# Patient Record
Sex: Female | Born: 1975 | Hispanic: Yes | Marital: Single | Smoking: Never smoker
Health system: Southern US, Community
[De-identification: ages and names within clinical notes are randomized; demographics above are authoritative.]

## PROBLEM LIST (undated history)

## (undated) DIAGNOSIS — O24419 Gestational diabetes mellitus in pregnancy, unspecified control: Secondary | ICD-10-CM

## (undated) DIAGNOSIS — Z3009 Encounter for other general counseling and advice on contraception: Secondary | ICD-10-CM

## (undated) HISTORY — DX: Gestational diabetes mellitus in pregnancy, unspecified control: O24.419

## (undated) HISTORY — PX: INTRAUTERINE DEVICE (IUD) INSERTION: SHX5877

## (undated) HISTORY — PX: CHOLECYSTECTOMY, LAPAROSCOPIC: SHX56

## (undated) HISTORY — DX: Encounter for other general counseling and advice on contraception: Z30.09

## (undated) HISTORY — PX: CHOLECYSTECTOMY: SHX55

---

## 1999-01-22 ENCOUNTER — Ambulatory Visit (HOSPITAL_COMMUNITY): Admission: RE | Admit: 1999-01-22 | Discharge: 1999-01-22 | Payer: Self-pay | Admitting: *Deleted

## 1999-06-27 ENCOUNTER — Inpatient Hospital Stay (HOSPITAL_COMMUNITY): Admission: AD | Admit: 1999-06-27 | Discharge: 1999-06-28 | Payer: Self-pay | Admitting: Obstetrics

## 2001-03-08 ENCOUNTER — Inpatient Hospital Stay (HOSPITAL_COMMUNITY): Admission: EM | Admit: 2001-03-08 | Discharge: 2001-03-09 | Payer: Self-pay | Admitting: Emergency Medicine

## 2002-11-29 ENCOUNTER — Ambulatory Visit (HOSPITAL_COMMUNITY): Admission: RE | Admit: 2002-11-29 | Discharge: 2002-11-29 | Payer: Self-pay | Admitting: *Deleted

## 2003-04-16 ENCOUNTER — Inpatient Hospital Stay (HOSPITAL_COMMUNITY): Admission: AD | Admit: 2003-04-16 | Discharge: 2003-04-18 | Payer: Self-pay | Admitting: *Deleted

## 2013-04-27 ENCOUNTER — Ambulatory Visit: Payer: No Typology Code available for payment source | Attending: Family Medicine | Admitting: Family Medicine

## 2013-04-27 ENCOUNTER — Encounter: Payer: Self-pay | Admitting: Family Medicine

## 2013-04-27 VITALS — BP 118/73 | HR 60 | Temp 97.4°F | Resp 14 | Ht 63.0 in | Wt 146.0 lb

## 2013-04-27 DIAGNOSIS — Z30431 Encounter for routine checking of intrauterine contraceptive device: Secondary | ICD-10-CM

## 2013-04-27 DIAGNOSIS — Z113 Encounter for screening for infections with a predominantly sexual mode of transmission: Secondary | ICD-10-CM

## 2013-04-27 DIAGNOSIS — Z975 Presence of (intrauterine) contraceptive device: Secondary | ICD-10-CM | POA: Insufficient documentation

## 2013-04-27 DIAGNOSIS — Z124 Encounter for screening for malignant neoplasm of cervix: Secondary | ICD-10-CM

## 2013-04-27 LAB — POCT URINALYSIS DIPSTICK
Bilirubin, UA: NEGATIVE
Glucose, UA: NEGATIVE
Ketones, UA: NEGATIVE
Nitrite, UA: NEGATIVE
Protein, UA: NEGATIVE
Spec Grav, UA: 1.025
Urobilinogen, UA: 0.2
pH, UA: 6

## 2013-04-27 NOTE — Progress Notes (Signed)
Patient ID: Heather Wilkins, female   DOB: 11/16/1975, 37 y.o.   MRN: [id number]  CC: pt presenting as a new patient   HPI: Pt is reporting that she is wanting to have a PAP and pelvic exam done as it has been 4 years since the last one. She has never had an abnormal PAP exam.  She is reporting that she has an IUD in place.     No Known Allergies Past Medical History  Diagnosis Date  . Family planning    No current outpatient prescriptions on file prior to visit.   No current facility-administered medications on file prior to visit.   Family History  Problem Relation Age of Onset  . Diabetes Mother   . Cancer Mother    History   Social History  . Marital Status: Single    Spouse Name: N/A    Number of Children: 0  . Years of Education: 12   Occupational History  . house cleaning    Social History Main Topics  . Smoking status: Former Games developer  . Smokeless tobacco: Not on file  . Alcohol Use: No  . Drug Use: No  . Sexually Active: Not on file   Other Topics Concern  . Not on file   Social History Narrative  . No narrative on file    Review of Systems  Constitutional: Negative for fever, chills, diaphoresis, activity change, appetite change and fatigue.  HENT: Negative for ear pain, nosebleeds, congestion, facial swelling, rhinorrhea, neck pain, neck stiffness and ear discharge.   Eyes: Negative for pain, discharge, redness, itching and visual disturbance.  Respiratory: Negative for cough, choking, chest tightness, shortness of breath, wheezing and stridor.   Cardiovascular: Negative for chest pain, palpitations and leg swelling.  Gastrointestinal: Negative for abdominal distention.  Genitourinary: Negative for dysuria, urgency, frequency, hematuria, flank pain, decreased urine volume, difficulty urinating and dyspareunia.  Musculoskeletal: Negative for back pain, joint swelling, arthralgias and gait problem.  Neurological: Negative for dizziness, tremors, seizures,  syncope, facial asymmetry, speech difficulty, weakness, light-headedness, numbness and headaches.  Hematological: Negative for adenopathy. Does not bruise/bleed easily.  Psychiatric/Behavioral: Negative for hallucinations, behavioral problems, confusion, dysphoric mood, decreased concentration and agitation.    Objective:   Filed Vitals:   04/27/13 1530  BP: 118/73  Pulse: 60  Temp: 97.4 F (36.3 C)  Resp: 14    Physical Exam  Constitutional: Appears well-developed and well-nourished. No distress.  HENT: Normocephalic. External right and left ear normal. Oropharynx is clear and moist.  Eyes: Conjunctivae and EOM are normal. PERRLA, no scleral icterus.  Neck: Normal ROM. Neck supple. No JVD. No tracheal deviation. No thyromegaly.  CVS: RRR, S1/S2 +, no murmurs, no gallops, no carotid bruit.  Pulmonary: Effort and breath sounds normal, no stridor, rhonchi, wheezes, rales.  Abdominal: Soft. BS +,  no distension, tenderness, rebound or guarding.  GU: normal external genitalia.  No abnormalities seen.  IUD string seen.  Musculoskeletal: Normal range of motion. No edema and no tenderness.  Lymphadenopathy: No lymphadenopathy noted, cervical, inguinal. Neuro: Alert. Normal reflexes, muscle tone coordination. No cranial nerve deficit. Skin: Skin is warm and dry. No rash noted. Not diaphoretic. No erythema. No pallor.  Psychiatric: Normal mood and affect. Behavior, judgment, thought content normal.   No results found for this basename: WBC, HGB, HCT, MCV, PLT   No results found for this basename: CREATININE, BUN, NA, K, CL, CO2    No results found for this basename: HGBA1C   Lipid Panel  No results found for this basename: chol, trig, hdl, cholhdl, vldl, ldlcalc     Assessment and plan:   Patient Active Problem List   Diagnosis Date Noted  . IUD (intrauterine device) in place 04/27/2013   PAP and pelvic has been done today.    Will await results.    Follow up 4-6 months as  needed for regular health care maintenance  The patient was given clear instructions to go to ER or return to medical center if symptoms don't improve, worsen or new problems develop.  The patient verbalized understanding.  The patient was told to call to get any lab results if not heard anything in the next week.    Rodney Langton, MD, CDE, FAAFP [facility] [place], [place]

## 2013-04-27 NOTE — Progress Notes (Signed)
Patient presents to establish care; states that she had an IUD 4 years ago and has not had a physical in that time.

## 2013-04-27 NOTE — Patient Instructions (Addendum)
Intrauterine Device Information An intrauterine device (IUD) is inserted into your uterus and prevents pregnancy. There are 2 types of IUDs available:  Copper IUD. This type of IUD is wrapped in copper wire and is placed inside the uterus. Copper makes the uterus and fallopian tubes produce a fluid that kills sperm. The copper IUD can stay in place for 10 years.  Hormone IUD. This type of IUD contains the hormone progestin (synthetic progesterone). The hormone thickens the cervical mucus and prevents sperm from entering the uterus, and it also thins the uterine lining to prevent implantation of a fertilized egg. The hormone can weaken or kill the sperm that get into the uterus. The hormone IUD can stay in place for 5 years. Your caregiver will make sure you are a good candidate for a contraceptive IUD. Discuss with your caregiver the possible side effects. ADVANTAGES  It is highly effective, reversible, long-acting, and low maintenance.  There are no estrogen-related side effects.  An IUD can be used when breastfeeding.  It is not associated with weight gain.  It works immediately after insertion.  The copper IUD does not interfere with your female hormones.  The progesterone IUD can make heavy menstrual periods lighter.  The progesterone IUD can be used for 5 years.  The copper IUD can be used for 10 years. DISADVANTAGES  The progesterone IUD can be associated with irregular bleeding patterns.  The copper IUD can make your menstrual flow heavier and more painful.  You may experience cramping and vaginal bleeding after insertion. Document Released: 08/19/2004 Document Revised: 12/08/2011 Document Reviewed: 01/18/2011 [facility] Patient Information 2014 [place], [place]. Pap Test A Pap test checks the cells on the surface of your cervix. Your doctor will look for cell changes that are not normal, an infection, or cancer. If the cells no longer look normal, it is called dysplasia.  Dysplasia can turn into cancer. Regular Pap tests are important to stop cancer from developing. BEFORE THE PROCEDURE  Ask your doctor when to schedule your Pap test. Timing the test around your period may be important.  Do not douche or have sex (intercourse) for 24 hours before the test.  Do not put creams on your vagina or use tampons for 24 hours before the test.  Go pee (urinate) just before the test. PROCEDURE  You will lie on an exam table with your feet in stirrups.  A warm metal or plastic tool (speculum) will be put in your vagina to open it up.  Your doctor will use a small, plastic brush or wooden spatula to take cells from your cervix.  The cells will be put in a lab container.  The cells will be checked under a microscope to see if they are normal or not. AFTER THE PROCEDURE Get your test results. If they are abnormal, you may need more tests. Document Released: 10/18/2010 Document Revised: 12/08/2011 Document Reviewed: 09/11/2011 [facility] Patient Information 2014 [place], [place].

## 2013-04-29 NOTE — Progress Notes (Signed)
Quick Note:  Please inform patient that her vaginal cytology tests came back positive for bacterial vaginosis. Please call in prescription for flagyl 500 mg, take 1 po BID, #14, No refills.   Rodney Langton, MD, CDE, FAAFP [facility] [place], [place]   ______

## 2013-05-02 ENCOUNTER — Other Ambulatory Visit: Payer: Self-pay | Admitting: Family Medicine

## 2013-05-02 ENCOUNTER — Telehealth: Payer: Self-pay | Admitting: *Deleted

## 2013-05-02 NOTE — Telephone Encounter (Signed)
05/02/13 Attempt to reach patient not available at [phone]  And a message was left via phoeat [phone] for patient to call clinic for lab results. P.Bud Kaeser,RN BSN [facility]

## 2013-05-02 NOTE — Progress Notes (Signed)
Quick Note:  Please inform patient that her Pap test came back slightly abnormal. There were some atypical cells noted. There was a yeast infection present. 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 I'm recommending that the patient have a repeat Pap test done within 6 months. I Am also referring her to the women's clinic for further evaluation and treatment. Please call and a prescription for fluconazole 150 mg take 1 by mouth x1 dispense #1, no refills. Please mail the patient some information on HPV.  Rodney Langton, MD, CDE, FAAFP [facility] [place], [place]   ______

## 2013-05-03 ENCOUNTER — Telehealth: Payer: Self-pay | Admitting: *Deleted

## 2013-05-03 NOTE — Telephone Encounter (Signed)
05/03/13 Unable to reach patient telephone number disconnected [phone]. Message left on telephone number [phone] to have Rue call clinic for lad results. P.Thereasa Iannello,RN BSN [facility]

## 2013-05-09 ENCOUNTER — Telehealth: Payer: Self-pay

## 2013-05-09 NOTE — Telephone Encounter (Signed)
Unable to reach patient  Number on file has been disconnected Not able to leave message

## 2013-05-26 ENCOUNTER — Ambulatory Visit (INDEPENDENT_AMBULATORY_CARE_PROVIDER_SITE_OTHER): Payer: No Typology Code available for payment source | Admitting: Family Medicine

## 2013-05-26 ENCOUNTER — Encounter: Payer: Self-pay | Admitting: Family Medicine

## 2013-05-26 VITALS — BP 116/65 | HR 59 | Temp 99.4°F | Ht 63.0 in | Wt 146.0 lb

## 2013-05-26 DIAGNOSIS — IMO0002 Reserved for concepts with insufficient information to code with codable children: Secondary | ICD-10-CM

## 2013-05-26 DIAGNOSIS — Z3009 Encounter for other general counseling and advice on contraception: Secondary | ICD-10-CM

## 2013-05-26 DIAGNOSIS — R6889 Other general symptoms and signs: Secondary | ICD-10-CM

## 2013-05-26 NOTE — Progress Notes (Signed)
Patient ID: Heather Wilkins, female   DOB: 06-Dec-1975, 37 y.o.   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 End of menstrual cycle today. They are regular. Patient given informed consent, signed copy in the chart.  Placed in lithotomy position. Cervix viewed with speculum and colposcope after application of acetic acid.   Colposcopy adequate (entire squamocolumnar junctions seen  in entirety) ?  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 One nabothian cyst Acetowhite lesions?no Punctation?no Mosaicism?  no Abnormal vasculature?  no Biopsies?no ECC?no Complications? none  COMMENTS: Patient was given post procedure instructions.  IUD strings remained undisturbed. Clinically normal pap. I recommend pap in one year.

## 2014-08-23 ENCOUNTER — Ambulatory Visit: Payer: Self-pay

## 2016-05-29 ENCOUNTER — Other Ambulatory Visit (HOSPITAL_COMMUNITY): Payer: Self-pay | Admitting: Physician Assistant

## 2016-05-29 DIAGNOSIS — IMO0002 Reserved for concepts with insufficient information to code with codable children: Secondary | ICD-10-CM

## 2016-05-29 DIAGNOSIS — Z3A12 12 weeks gestation of pregnancy: Secondary | ICD-10-CM

## 2016-05-29 DIAGNOSIS — O351XX0 Maternal care for (suspected) chromosomal abnormality in fetus, not applicable or unspecified: Secondary | ICD-10-CM

## 2016-05-29 LAB — OB RESULTS CONSOLE HEPATITIS B SURFACE ANTIGEN: Hepatitis B Surface Ag: NEGATIVE

## 2016-05-29 LAB — OB RESULTS CONSOLE RPR: RPR: NONREACTIVE

## 2016-05-29 LAB — OB RESULTS CONSOLE ABO/RH: RH Type: POSITIVE

## 2016-05-29 LAB — OB RESULTS CONSOLE ANTIBODY SCREEN: Antibody Screen: NEGATIVE

## 2016-05-29 LAB — OB RESULTS CONSOLE RUBELLA ANTIBODY, IGM: Rubella: IMMUNE

## 2016-05-29 LAB — OB RESULTS CONSOLE HIV ANTIBODY (ROUTINE TESTING): HIV: NONREACTIVE

## 2016-05-29 LAB — OB RESULTS CONSOLE GC/CHLAMYDIA
Chlamydia: NEGATIVE
GC PROBE AMP, GENITAL: NEGATIVE

## 2016-06-10 ENCOUNTER — Encounter (HOSPITAL_COMMUNITY): Payer: Self-pay

## 2016-06-10 ENCOUNTER — Ambulatory Visit (HOSPITAL_COMMUNITY)
Admission: RE | Admit: 2016-06-10 | Discharge: 2016-06-10 | Disposition: A | Discharge status: Home / Self Care | Payer: Self-pay | Source: Ambulatory Visit | Attending: Physician Assistant | Admitting: Physician Assistant

## 2016-06-10 VITALS — BP 120/73 | HR 71 | Wt 149.4 lb

## 2016-06-10 DIAGNOSIS — O09529 Supervision of elderly multigravida, unspecified trimester: Secondary | ICD-10-CM | POA: Insufficient documentation

## 2016-06-10 DIAGNOSIS — IMO0002 Reserved for concepts with insufficient information to code with codable children: Secondary | ICD-10-CM

## 2016-06-10 DIAGNOSIS — O09521 Supervision of elderly multigravida, first trimester: Secondary | ICD-10-CM | POA: Insufficient documentation

## 2016-06-10 DIAGNOSIS — Z3A13 13 weeks gestation of pregnancy: Secondary | ICD-10-CM | POA: Insufficient documentation

## 2016-06-10 DIAGNOSIS — Z315 Encounter for genetic counseling: Secondary | ICD-10-CM | POA: Insufficient documentation

## 2016-06-10 DIAGNOSIS — O351XX0 Maternal care for (suspected) chromosomal abnormality in fetus, not applicable or unspecified: Secondary | ICD-10-CM

## 2016-06-10 DIAGNOSIS — Z3A12 12 weeks gestation of pregnancy: Secondary | ICD-10-CM

## 2016-06-10 NOTE — Progress Notes (Signed)
Genetic Counseling  High-Risk Gestation Note  Appointment Date:  06/10/2016 Referred By: Heather GalWilliams, Rick D, PA-C Date of Birth:  10/26/1975   Pregnancy History: [id number] Estimated Date of Delivery: 12/13/16 Estimated Gestational Age: 578w3d Attending: Particia NearingMartha Decker, MD   [name] was seen for genetic counseling because of a maternal age of 40 y.o..     In summary:  Discussed AMA and associated risk for fetal aneuploidy  Discussed options for screening  First screen-declined bloodwork  Quad-declined  NIPS-elected to pursue today (Panorama)  Ultrasound-NT performed today  Discussed diagnostic testing options  Amniocentesis-declined  Reviewed family history concerns  She was counseled regarding maternal age and the association with risk for chromosome conditions due to nondisjunction with aging of the ova.   We reviewed chromosomes, nondisjunction, and the associated 1 in 9151 risk for fetal aneuploidy related to a maternal age of 40 y.o. at 1678w3d gestation.  She was counseled that the risk for aneuploidy decreases as gestational age increases, accounting for those pregnancies which spontaneously abort.  We specifically discussed Down syndrome (trisomy 921), trisomies 6113 and 8018, and sex chromosome aneuploidies (47,XXX and 47,XXY) including the common features and prognoses of each.   We reviewed available screening options including First Screen, Quad screen, noninvasive prenatal screening (NIPS)/cell free DNA (cfDNA) screening, and detailed ultrasound.  She was counseled that screening tests are used to modify a patient's a priori risk for aneuploidy, typically based on age. This estimate provides a pregnancy specific risk assessment. We reviewed the benefits and limitations of each option. Specifically, we discussed the conditions for which each test screens, the detection rates, and false positive rates of each. She was also counseled regarding diagnostic testing via  amniocentesis. We reviewed the approximate 1 in 300-500 risk for complications from amniocentesis, including spontaneous pregnancy loss. We discussed the possible results that the tests might provide including: positive, negative, unanticipated, and no result. Finally, they were counseled regarding the cost of each option and potential out of pocket expenses.  After consideration of all the options, she elected to proceed with NIPS.  Those results will be available in 8-10 days.    She also expressed interest in pursuing a nuchal translucency ultrasound, which was performed today.  The report will be documented separately. Detailed ultrasound is available to the patient at ~18+ weeks gestation.  This appointment was scheduled for 07/21/16. She understands that screening tests cannot rule out all birth defects or genetic syndromes. The patient was advised of this limitation and states she still does not want additional testing at this time.   Both family histories were reviewed and found to be noncontributory for birth defects, intellectual disability, and known genetic conditions. Without further information regarding the provided family history, an accurate genetic risk cannot be calculated. Further genetic counseling is warranted if more information is obtained.  [name] denied exposure to environmental toxins or chemical agents. She denied the use of alcohol, tobacco or street drugs. She denied significant viral illnesses during the course of her pregnancy. Her medical and surgical histories were noncontributory.   I counseled Ms. Heather Wilkins regarding the above risks and available options.  The approximate face-to-face time with the genetic counselor was 45 minutes.  Quinn PlowmanKaren Corwin Kuiken, MS,  Certified Genetic Counselor 06/10/2016

## 2016-06-19 ENCOUNTER — Telehealth (HOSPITAL_COMMUNITY): Payer: Self-pay | Admitting: MS"

## 2016-06-19 NOTE — Telephone Encounter (Signed)
Attempted to call patient regarding results of noninvasive prenatal screening (NIPS)/ prenatal cell free DNA testing, which are within normal range.  Left message for patient to return call.   Heather BraunKaren Milinda Sweeney 06/19/2016 10:48 AM

## 2016-06-20 NOTE — Telephone Encounter (Signed)
Called Heather Wilkins to discuss her prenatal cell free DNA test results.  Ms. Heather Wilkins had Panorama testing through [facility].  Testing was offered because of maternal age.   The patient was identified by name and DOB.  We reviewed that these are within normal limits, showing a less than 1 in 10,000 risk for trisomies 21, 18 and 13, and monosomy X (Turner syndrome).  In addition, the risk for triploidy/vanishing twin and sex chromosome trisomies (47,XXX and 47,XXY) was also low risk. We reviewed that this testing identifies > 99% of pregnancies with trisomy 2221, trisomy 3613, sex chromosome trisomies (47,XXX and 47,XXY), and triploidy. The detection rate for trisomy 18 is 96%.  The detection rate for monosomy X is ~92%.  The false positive rate is <0.1% for all conditions. Testing was also consistent with female fetal sex.  The patient did wish to know fetal sex.  She understands that this testing does not identify all genetic conditions.  All questions were answered to her satisfaction, she was encouraged to call with additional questions or concerns.  Quinn PlowmanKaren Maanasa Aderhold, MS Certified Genetic Counselor 06/20/2016 4:06 PM

## 2016-06-26 ENCOUNTER — Other Ambulatory Visit (HOSPITAL_COMMUNITY): Payer: Self-pay

## 2016-06-27 ENCOUNTER — Encounter (HOSPITAL_COMMUNITY): Payer: Self-pay

## 2016-07-21 ENCOUNTER — Ambulatory Visit (HOSPITAL_COMMUNITY): Payer: Self-pay

## 2016-07-21 ENCOUNTER — Encounter (HOSPITAL_COMMUNITY): Payer: Self-pay

## 2016-11-19 LAB — OB RESULTS CONSOLE GBS: STREP GROUP B AG: POSITIVE

## 2016-11-27 ENCOUNTER — Inpatient Hospital Stay (HOSPITAL_COMMUNITY): Payer: Self-pay

## 2016-11-27 ENCOUNTER — Encounter (HOSPITAL_COMMUNITY): Payer: Self-pay | Admitting: *Deleted

## 2016-11-27 ENCOUNTER — Inpatient Hospital Stay (HOSPITAL_COMMUNITY)
Admission: AD | Admit: 2016-11-27 | Discharge: 2016-11-27 | Disposition: A | Discharge status: Home / Self Care | Payer: Self-pay | Source: Ambulatory Visit | Attending: Obstetrics & Gynecology | Admitting: Obstetrics & Gynecology

## 2016-11-27 DIAGNOSIS — Z3A37 37 weeks gestation of pregnancy: Secondary | ICD-10-CM | POA: Insufficient documentation

## 2016-11-27 DIAGNOSIS — O4693 Antepartum hemorrhage, unspecified, third trimester: Secondary | ICD-10-CM | POA: Insufficient documentation

## 2016-11-27 DIAGNOSIS — O09523 Supervision of elderly multigravida, third trimester: Secondary | ICD-10-CM | POA: Insufficient documentation

## 2016-11-27 DIAGNOSIS — O09529 Supervision of elderly multigravida, unspecified trimester: Secondary | ICD-10-CM

## 2016-11-27 LAB — WET PREP, GENITAL
Clue Cells Wet Prep HPF POC: NONE SEEN
SPERM: NONE SEEN
TRICH WET PREP: NONE SEEN
YEAST WET PREP: NONE SEEN

## 2016-11-27 NOTE — MAU Provider Note (Signed)
Patient Heather Wilkins is a 41 year old G4P3003 at 37 weeks and 5 days here with complaints of vaginal bleeding. The episode occurred this morning when she wiped. She did not have to use a pantyliner; the bleeding is dark brown and mucousy. She denies contractions, leaking of fluid or decreased fetal movements.  History     CSN: [id number]  Arrival date and time: 11/27/16 1026   First Provider Initiated Contact with Patient 11/27/16 1114      Chief Complaint  Patient presents with  . Vaginal Bleeding   Vaginal Bleeding  The patient's primary symptoms include vaginal bleeding. The patient's pertinent negatives include no genital itching. This is a new problem. The current episode started today. The problem occurs rarely. The problem has been unchanged. Pertinent negatives include no abdominal pain, back pain, dysuria, fever or headaches. The vaginal discharge was bloody and brown. The vaginal bleeding is spotting. She has not been passing clots. She has not been passing tissue.    OB History    Gravida Para Term Preterm AB Living   4 3 3  0 0     SAB TAB Ectopic Multiple Live Births   0 0 0          Past Medical History:  Diagnosis Date  . Family planning     Past Surgical History:  Procedure Laterality Date  . CHOLECYSTECTOMY    . CHOLECYSTECTOMY, LAPAROSCOPIC    . INTRAUTERINE DEVICE (IUD) INSERTION      Family History  Problem Relation Age of Onset  . Diabetes Mother   . Cancer Mother     Social History  Substance Use Topics  . Smoking status: Never Smoker  . Smokeless tobacco: Never Used  . Alcohol use No    Allergies: No Known Allergies  Prescriptions Prior to Admission  Medication Sig Dispense Refill Last Dose  . Prenatal Vit w/Fe-Methylfol-FA (PNV PO) Take by mouth.   11/26/2016 at Unknown time    Review of Systems  Constitutional: Negative for fever.  Gastrointestinal: Negative for abdominal pain.  Genitourinary: Positive for vaginal bleeding. Negative  for dysuria.  Musculoskeletal: Negative for back pain.  Neurological: Negative for headaches.   Physical Exam   Blood pressure 143/74, pulse 70, temperature 99.2 F (37.3 C), resp. rate 18, last menstrual period 03/08/2016.  Physical Exam  Constitutional: She is oriented to person, place, and time. She appears well-developed.  HENT:  Head: Normocephalic.  Eyes: Pupils are equal, round, and reactive to light.  Neck: Normal range of motion.  Respiratory: Effort normal.  GI: Soft. She exhibits no distension and no mass. There is no tenderness. There is no rebound and no guarding.  Genitourinary:  Genitourinary Comments: NEFG; no lesions on vaginal walls.Cervix is pink with scant dark brown mucousy blood extruding from the os. Cervix is closed and posterior.  No CMT, no suprapubic tenderenss. No adnexal tenderness.   Musculoskeletal: Normal range of motion.  Neurological: She is alert and oriented to person, place, and time. She has normal reflexes.  Skin: Skin is warm and dry.    MAU Course  Procedures  MDM -NST: 135 bpm, + accel, no decel, moderate variability; occasional contractions that patient does not feel -US: no evidence of placental abruption at this time;  AFI normal  Assessment and Plan   1. Antepartum multigravida of advanced maternal age   2. Vaginal bleeding in pregnancy, third trimester   3. [redacted] weeks gestation of pregnancy    2. No evidence of  placental abrutption on [place] at this time. Patient stable for discharge. Bleeding precautions and when to return to the MAU reviewed (decreased fetal movements, leaking of fluid, contractions)  34. Patient to keep her prenatal appointment on Monday.  Charlesetta Garibaldi Kooistra CNM 11/27/2016, 11:42 AM

## 2016-11-27 NOTE — Discharge Instructions (Signed)
Vaginal Bleeding During Pregnancy, Third Trimester A small amount of bleeding (spotting) from the vagina is relatively common in pregnancy. Various things can cause bleeding or spotting in pregnancy. Sometimes the bleeding is normal and is not a problem. However, bleeding during the third trimester can also be a sign of something serious for the mother and the baby. Be sure to tell your health care provider about any vaginal bleeding right away. Some possible causes of vaginal bleeding during the third trimester include:  The placenta may be partially or completely covering the opening to the cervix (placenta previa).  The placenta may have separated from the uterus (abruption of the placenta).  There may be an infection or growth on the cervix.  You may be starting labor, called discharging of the mucus plug.  The placenta may grow into the muscle layer of the uterus (placenta accreta). Follow these instructions at home: Watch your condition for any changes. The following actions may help to lessen any discomfort you are feeling:  Follow your health care provider's instructions for limiting your activity. If your health care provider orders bed rest, you may need to stay in bed and only get up to use the bathroom. However, your health care provider may allow you to continue light activity.  If needed, make plans for someone to help with your regular activities and responsibilities while you are on bed rest.  Keep track of the number of pads you use each day, how often you change pads, and how soaked (saturated) they are. Write this down.  Do not use tampons. Do not douche.  Do not have sexual intercourse or orgasms until approved by your health care provider.  Follow your health care provider's advice about lifting, driving, and physical activities.  If you pass any tissue from your vagina, save the tissue so you can show it to your health care provider.  Only take over-the-counter or  prescription medicines as directed by your health care provider.  Do not take aspirin because it can make you bleed.  Keep all follow-up appointments as directed by your health care provider. Contact a health care provider if:  You have any vaginal bleeding during any part of your pregnancy.  You have cramps or labor pains.  You have a fever, not controlled by medicine. Get help right away if:  You have severe cramps or pain in your back or belly (abdomen).  You have chills.  You have a gush of fluid from the vagina.  You pass large clots or tissue from your vagina.  Your bleeding increases.  You feel light-headed or weak.  You pass out.  You feel less movement or no movement of the baby. This information is not intended to replace advice given to you by your health care provider. Make sure you discuss any questions you have with your health care provider. Document Released: 12/06/2002 Document Revised: 02/21/2016 Document Reviewed: 05/23/2013 Elsevier Interactive Patient Education  2017 [employer]  

## 2016-11-27 NOTE — MAU Note (Signed)
Pt stated when she woke up this morning and went to the bathroom she saw some blood when she wiped. Denies any pain or contractions and reports good fetal movement.

## 2016-11-28 LAB — GC/CHLAMYDIA PROBE AMP (~~LOC~~) NOT AT ARMC
Chlamydia: NEGATIVE
Neisseria Gonorrhea: NEGATIVE

## 2016-11-30 ENCOUNTER — Inpatient Hospital Stay (HOSPITAL_COMMUNITY)
Admission: AD | Admit: 2016-11-30 | Discharge: 2016-12-02 | DRG: 775 | Disposition: A | Discharge status: Home / Self Care | Payer: Medicaid Other | Source: Ambulatory Visit | Attending: Obstetrics and Gynecology | Admitting: Obstetrics and Gynecology

## 2016-11-30 ENCOUNTER — Inpatient Hospital Stay (HOSPITAL_COMMUNITY): Payer: Medicaid Other | Admitting: Anesthesiology

## 2016-11-30 ENCOUNTER — Encounter (HOSPITAL_COMMUNITY): Payer: Self-pay | Admitting: *Deleted

## 2016-11-30 DIAGNOSIS — Z3493 Encounter for supervision of normal pregnancy, unspecified, third trimester: Secondary | ICD-10-CM | POA: Diagnosis present

## 2016-11-30 DIAGNOSIS — O99824 Streptococcus B carrier state complicating childbirth: Principal | ICD-10-CM | POA: Diagnosis present

## 2016-11-30 DIAGNOSIS — R030 Elevated blood-pressure reading, without diagnosis of hypertension: Secondary | ICD-10-CM | POA: Diagnosis present

## 2016-11-30 DIAGNOSIS — O09529 Supervision of elderly multigravida, unspecified trimester: Secondary | ICD-10-CM

## 2016-11-30 DIAGNOSIS — O26893 Other specified pregnancy related conditions, third trimester: Secondary | ICD-10-CM | POA: Diagnosis present

## 2016-11-30 DIAGNOSIS — Z3A38 38 weeks gestation of pregnancy: Secondary | ICD-10-CM

## 2016-11-30 LAB — CBC
HEMATOCRIT: 35.2 % — AB (ref 36.0–46.0)
HEMOGLOBIN: 12.2 g/dL (ref 12.0–15.0)
MCH: 32.1 pg (ref 26.0–34.0)
MCHC: 34.7 g/dL (ref 30.0–36.0)
MCV: 92.6 fL (ref 78.0–100.0)
Platelets: 218 10*3/uL (ref 150–400)
RBC: 3.8 MIL/uL — ABNORMAL LOW (ref 3.87–5.11)
RDW: 15 % (ref 11.5–15.5)
WBC: 12.2 10*3/uL — ABNORMAL HIGH (ref 4.0–10.5)

## 2016-11-30 LAB — ABO/RH: ABO/RH(D): B POS

## 2016-11-30 LAB — RPR: RPR Ser Ql: NONREACTIVE

## 2016-11-30 LAB — TYPE AND SCREEN
ABO/RH(D): B POS
Antibody Screen: NEGATIVE

## 2016-11-30 MED ORDER — DIPHENHYDRAMINE HCL 25 MG PO CAPS: 25.0000 mg | ORAL_CAPSULE | Freq: Four times a day (QID) | ORAL | Status: DC | PRN

## 2016-11-30 MED ORDER — PHENYLEPHRINE 40 MCG/ML (10ML) SYRINGE FOR IV PUSH (FOR BLOOD PRESSURE SUPPORT): 80.0000 ug | PREFILLED_SYRINGE | INTRAVENOUS | Status: DC | PRN

## 2016-11-30 MED ORDER — PENICILLIN G POT IN DEXTROSE 60000 UNIT/ML IV SOLN
3.0000 10*6.[IU] | INTRAVENOUS | Status: DC
  Filled 2016-11-30 (×2): qty 50

## 2016-11-30 MED ORDER — ZOLPIDEM TARTRATE 5 MG PO TABS: 5.0000 mg | ORAL_TABLET | Freq: Every evening | ORAL | Status: DC | PRN

## 2016-11-30 MED ORDER — OXYTOCIN 40 UNITS IN LACTATED RINGERS INFUSION - SIMPLE MED
2.5000 [IU]/h | INTRAVENOUS | Status: DC
  Administered 2016-11-30: 2.5 [IU]/h via INTRAVENOUS
  Filled 2016-11-30: qty 1000

## 2016-11-30 MED ORDER — BENZOCAINE-MENTHOL 20-0.5 % EX AERO: 1.0000 "application " | INHALATION_SPRAY | CUTANEOUS | Status: DC | PRN

## 2016-11-30 MED ORDER — OXYTOCIN BOLUS FROM INFUSION: 500.0000 mL | Freq: Once | INTRAVENOUS | Status: DC

## 2016-11-30 MED ORDER — OXYCODONE-ACETAMINOPHEN 5-325 MG PO TABS: 1.0000 | ORAL_TABLET | ORAL | Status: DC | PRN

## 2016-11-30 MED ORDER — EPHEDRINE 5 MG/ML INJ
10.0000 mg | INTRAVENOUS | Status: DC | PRN
  Filled 2016-11-30: qty 2

## 2016-11-30 MED ORDER — DIBUCAINE 1 % RE OINT: 1.0000 "application " | TOPICAL_OINTMENT | RECTAL | Status: DC | PRN

## 2016-11-30 MED ORDER — ONDANSETRON HCL 4 MG/2ML IJ SOLN: 4.0000 mg | Freq: Four times a day (QID) | INTRAMUSCULAR | Status: DC | PRN

## 2016-11-30 MED ORDER — FENTANYL 2.5 MCG/ML BUPIVACAINE 1/10 % EPIDURAL INFUSION (WH - ANES)
14.0000 mL/h | INTRAMUSCULAR | Status: DC | PRN
  Administered 2016-11-30: 14 mL/h via EPIDURAL
  Filled 2016-11-30: qty 100

## 2016-11-30 MED ORDER — PENICILLIN G POTASSIUM 5000000 UNITS IJ SOLR
5.0000 10*6.[IU] | Freq: Once | INTRAVENOUS | Status: AC
  Administered 2016-11-30: 5 10*6.[IU] via INTRAVENOUS
  Filled 2016-11-30: qty 5

## 2016-11-30 MED ORDER — DIPHENHYDRAMINE HCL 50 MG/ML IJ SOLN: 12.5000 mg | INTRAMUSCULAR | Status: DC | PRN

## 2016-11-30 MED ORDER — EPHEDRINE 5 MG/ML INJ: 10.0000 mg | INTRAVENOUS | Status: DC | PRN

## 2016-11-30 MED ORDER — LACTATED RINGERS IV SOLN: 500.0000 mL | Freq: Once | INTRAVENOUS | Status: DC

## 2016-11-30 MED ORDER — LACTATED RINGERS IV SOLN: INTRAVENOUS | Status: DC

## 2016-11-30 MED ORDER — PHENYLEPHRINE 40 MCG/ML (10ML) SYRINGE FOR IV PUSH (FOR BLOOD PRESSURE SUPPORT)
80.0000 ug | PREFILLED_SYRINGE | INTRAVENOUS | Status: DC | PRN
  Filled 2016-11-30: qty 5
  Filled 2016-11-30: qty 10

## 2016-11-30 MED ORDER — IBUPROFEN 600 MG PO TABS
600.0000 mg | ORAL_TABLET | Freq: Four times a day (QID) | ORAL | Status: DC
  Administered 2016-11-30 – 2016-12-02 (×9): 600 mg via ORAL
  Filled 2016-11-30 (×10): qty 1

## 2016-11-30 MED ORDER — LIDOCAINE HCL (PF) 1 % IJ SOLN
30.0000 mL | INTRAMUSCULAR | Status: DC | PRN
  Filled 2016-11-30: qty 30

## 2016-11-30 MED ORDER — PHENYLEPHRINE 40 MCG/ML (10ML) SYRINGE FOR IV PUSH (FOR BLOOD PRESSURE SUPPORT)
80.0000 ug | PREFILLED_SYRINGE | INTRAVENOUS | Status: DC | PRN
  Filled 2016-11-30: qty 10
  Filled 2016-11-30: qty 5

## 2016-11-30 MED ORDER — TETANUS-DIPHTH-ACELL PERTUSSIS 5-2.5-18.5 LF-MCG/0.5 IM SUSP: 0.5000 mL | Freq: Once | INTRAMUSCULAR | Status: DC

## 2016-11-30 MED ORDER — ACETAMINOPHEN 325 MG PO TABS: 650.0000 mg | ORAL_TABLET | ORAL | Status: DC | PRN

## 2016-11-30 MED ORDER — ACETAMINOPHEN 325 MG PO TABS
650.0000 mg | ORAL_TABLET | ORAL | Status: DC | PRN
Start: 2016-11-30 — End: 2016-12-02

## 2016-11-30 MED ORDER — COCONUT OIL OIL: 1.0000 "application " | TOPICAL_OIL | Status: DC | PRN

## 2016-11-30 MED ORDER — SIMETHICONE 80 MG PO CHEW: 80.0000 mg | CHEWABLE_TABLET | ORAL | Status: DC | PRN

## 2016-11-30 MED ORDER — LIDOCAINE HCL (PF) 1 % IJ SOLN
INTRAMUSCULAR | Status: DC | PRN
  Administered 2016-11-30 (×2): 5 mL

## 2016-11-30 MED ORDER — LACTATED RINGERS IV SOLN
500.0000 mL | Freq: Once | INTRAVENOUS | Status: AC
  Administered 2016-11-30: 500 mL via INTRAVENOUS

## 2016-11-30 MED ORDER — PRENATAL MULTIVITAMIN CH
1.0000 | ORAL_TABLET | Freq: Every day | ORAL | Status: DC
  Administered 2016-11-30 – 2016-12-02 (×3): 1 via ORAL
  Filled 2016-11-30 (×3): qty 1

## 2016-11-30 MED ORDER — SENNOSIDES-DOCUSATE SODIUM 8.6-50 MG PO TABS
2.0000 | ORAL_TABLET | ORAL | Status: DC
  Administered 2016-12-01 (×2): 2 via ORAL
  Filled 2016-11-30 (×2): qty 2

## 2016-11-30 MED ORDER — SOD CITRATE-CITRIC ACID 500-334 MG/5ML PO SOLN: 30.0000 mL | ORAL | Status: DC | PRN

## 2016-11-30 MED ORDER — FENTANYL 2.5 MCG/ML BUPIVACAINE 1/10 % EPIDURAL INFUSION (WH - ANES): 14.0000 mL/h | INTRAMUSCULAR | Status: DC | PRN

## 2016-11-30 MED ORDER — ONDANSETRON HCL 4 MG/2ML IJ SOLN: 4.0000 mg | INTRAMUSCULAR | Status: DC | PRN

## 2016-11-30 MED ORDER — ONDANSETRON HCL 4 MG PO TABS: 4.0000 mg | ORAL_TABLET | ORAL | Status: DC | PRN

## 2016-11-30 MED ORDER — WITCH HAZEL-GLYCERIN EX PADS: 1.0000 "application " | MEDICATED_PAD | CUTANEOUS | Status: DC | PRN

## 2016-11-30 MED ORDER — OXYCODONE-ACETAMINOPHEN 5-325 MG PO TABS: 2.0000 | ORAL_TABLET | ORAL | Status: DC | PRN

## 2016-11-30 MED ORDER — LACTATED RINGERS IV SOLN
500.0000 mL | INTRAVENOUS | Status: DC | PRN
  Administered 2016-11-30: 1000 mL via INTRAVENOUS

## 2016-11-30 MED ORDER — FENTANYL CITRATE (PF) 100 MCG/2ML IJ SOLN
100.0000 ug | INTRAMUSCULAR | Status: DC | PRN
  Administered 2016-11-30: 100 ug via INTRAVENOUS
  Filled 2016-11-30: qty 2

## 2016-11-30 NOTE — Anesthesia Procedure Notes (Signed)
Epidural Patient location during procedure: OB  Staffing Anesthesiologist: Esther Bradstreet Performed: anesthesiologist   Preanesthetic Checklist Completed: patient identified, site marked, surgical consent, pre-op evaluation, timeout performed, IV checked, risks and benefits discussed and monitors and equipment checked  Epidural Patient position: sitting Prep: DuraPrep Patient monitoring: heart rate, continuous pulse ox and blood pressure Approach: right paramedian Location: L3-L4 Injection technique: LOR saline  Needle:  Needle type: Tuohy  Needle gauge: 17 G Needle length: 9 cm and 9 Needle insertion depth: 6 cm Catheter type: closed end flexible Catheter size: 20 Guage Catheter at skin depth: 10 cm Test dose: negative  Assessment Events: blood not aspirated, injection not painful, no injection resistance, negative IV test and no paresthesia  Additional Notes Patient identified. Risks/Benefits/Options discussed with patient including but not limited to bleeding, infection, nerve damage, paralysis, failed block, incomplete pain control, headache, blood pressure changes, nausea, vomiting, reactions to medication both or allergic, itching and postpartum back pain. Confirmed with bedside nurse the patient's most recent platelet count. Confirmed with patient that they are not currently taking any anticoagulation, have any bleeding history or any family history of bleeding disorders. Patient expressed understanding and wished to proceed. All questions were answered. Sterile technique was used throughout the entire procedure. Please see nursing notes for vital signs. Test dose was given through epidural needle and negative prior to continuing to dose epidural or start infusion. Warning signs of high block given to the patient including shortness of breath, tingling/numbness in hands, complete motor block, or any concerning symptoms with instructions to call for help. Patient was given  instructions on fall risk and not to get out of bed. All questions and concerns addressed with instructions to call with any issues.     

## 2016-11-30 NOTE — Anesthesia Preprocedure Evaluation (Signed)

## 2016-11-30 NOTE — Progress Notes (Signed)
Dr Genevie AnnSchenk notified of pt's repeat sve of 3.5cm. FHR reactive and serial b/p readings. [facility]

## 2016-11-30 NOTE — Progress Notes (Signed)
Dr Genevie AnnSchenk notified of pt's admission and status. Aware of ctx pattern, sve, elevated B/Ps, variables. Will obs and hour and reck cervix.

## 2016-11-30 NOTE — Anesthesia Postprocedure Evaluation (Signed)
Anesthesia Post Note  Patient: Heather Wilkins  Procedure(s) Performed: * No procedures listed *  Patient location during evaluation: Mother Baby Anesthesia Type: Epidural Level of consciousness: awake Pain management: satisfactory to patient Vital Signs Assessment: post-procedure vital signs reviewed and stable Respiratory status: spontaneous breathing Cardiovascular status: stable Anesthetic complications: no        Last Vitals:  Vitals:   11/30/16 1100 11/30/16 1600  BP: 138/72 120/68  Pulse: 95 72  Resp: 18 18  Temp: 36.8 C 36.5 C    Last Pain:  Vitals:   11/30/16 1718  TempSrc:   PainSc: 0-No pain   Pain Goal: Patients Stated Pain Goal: 0 (11/30/16 0357)               Cephus ShellingBURGER,Lylith Bebeau

## 2016-11-30 NOTE — MAU Note (Signed)
Ctx since Sat but stronger since 0130. Some brown, mucousy d/c but denies LOF. 1cm last sve per pt

## 2016-11-30 NOTE — H&P (Signed)
LABOR AND DELIVERY ADMISSION HISTORY AND PHYSICAL NOTE  Heather Wilkins is a 41 y.o. female [phone] with IUP at 6158w1d by LMP confirmed with 13 wk US presenting for SOL. No problems or concerns. GBS positive.   She reports positive fetal movement. She denies leakage of fluid or vaginal bleeding.  Prenatal History/Complications:  Past Medical History: Past Medical History:  Diagnosis Date  . Family planning     Past Surgical History: Past Surgical History:  Procedure Laterality Date  . CHOLECYSTECTOMY    . CHOLECYSTECTOMY, LAPAROSCOPIC    . INTRAUTERINE DEVICE (IUD) INSERTION      Obstetrical History: OB History    Gravida Para Term Preterm AB Living   4 3 3  0 0 3   SAB TAB Ectopic Multiple Live Births   0 0 0          Social History: Social History   Social History  . Marital status: Single    Spouse name: N/A  . Number of children: 0  . Years of education: 12   Occupational History  . house cleaning    Social History Main Topics  . Smoking status: Never Smoker  . Smokeless tobacco: Never Used  . Alcohol use No  . Drug use: No  . Sexual activity: Not Asked   Other Topics Concern  . None   Social History Narrative   ** Merged History Encounter **        Family History: Family History  Problem Relation Age of Onset  . Diabetes Mother   . Cancer Mother     Allergies: No Known Allergies  Prescriptions Prior to Admission  Medication Sig Dispense Refill Last Dose  . Prenatal Vit w/Fe-Methylfol-FA (PNV PO) Take by mouth.   11/29/2016 at Unknown time     Review of Systems   All systems reviewed and negative except as stated in HPI  Blood pressure (!) 103/49, pulse 66, temperature 98.1 F (36.7 C), temperature source Oral, resp. rate 16, height 5\' 4"  (1.626 m), weight 186 lb (84.4 kg), last menstrual period 03/08/2016. General appearance: alert, cooperative and appears stated age Lungs: no respiratory Distress Heart: regular rate distal pulses  intact Abdomen: soft, non-tender; bowel sounds normal Extremities: No calf swelling or tenderness Presentation: cephalicby nursign exam Fetal monitoring: category 1 Uterine activity: contractions every 3-5 mintues Dilation: 6.5 Effacement (%): 100 Station: -1 Exam by:: Malva CoganA. Schwarz RN    Prenatal labs: ABO, Rh: --/--/B POS (03/04 0528) Antibody: NEG (03/04 0528) Rubella: immune RPR: Nonreactive (08/31 0000)  HBsAg: Negative (08/31 0000)  HIV: Non-reactive (08/31 0000)  GBS: Positive (02/21 0000)  1 hr Glucola: abnormal, 3 hours normal Genetic screening:  Quad normal Anatomy US: normal  Prenatal Transfer Tool  Maternal Diabetes: No Genetic Screening: Normal Maternal Ultrasounds/Referrals: Normal Fetal Ultrasounds or other Referrals:  None Maternal Substance Abuse:  No Significant Maternal Medications:  None Significant Maternal Lab Results: Lab values include: Group B Strep positive  Results for orders placed or performed during the hospital encounter of 11/30/16 (from the past 24 hour(s))  CBC   Collection Time: 11/30/16  5:28 AM  Result Value Ref Range   WBC 12.2 (H) 4.0 - 10.5 K/uL   RBC 3.80 (L) 3.87 - 5.11 MIL/uL   Hemoglobin 12.2 12.0 - 15.0 g/dL   HCT 30.835.2 (L) 65.736.0 - 84.646.0 %   MCV 92.6 78.0 - 100.0 fL   MCH 32.1 26.0 - 34.0 pg   MCHC 34.7 30.0 - 36.0 g/dL  RDW 15.0 11.5 - 15.5 %   Platelets 218 150 - 400 K/uL  Type and screen [facility] [place]   Collection Time: 11/30/16  5:28 AM  Result Value Ref Range   ABO/RH(D) B POS    Antibody Screen NEG    Sample Expiration 12/03/2016     Patient Active Problem List   Diagnosis Date Noted  . Normal labor 11/30/2016  . Advanced maternal age in multigravida 06/10/2016  . [redacted] weeks gestation of pregnancy   . IUD (intrauterine device) in place 04/27/2013    Assessment: Heather Wilkins is a 41 y.o. G4P3003 at [redacted]w[redacted]d here for SOL  #Labor:expectant management, after 2 doses of PCN plan for  AROM #Pain: Epidural in place #FWB: Category 1 #ID:  GBS postive-> PCN ordered #MOF: breast #MOC:OCPS>paraguard #Circ:  Out patient #: elevated BP - patient has had a few BP's >140/90 but some BPS as low as 110/70s. Will continue to monitor, labs ordered  Ernestina Penna 11/30/2016, 7:01 AM

## 2016-11-30 NOTE — Progress Notes (Signed)
Delivery Note At 9:14 AM a viable female was delivered via Vaginal, Spontaneous Delivery (presentation: vertex; ROA) without complications.  APGAR: 9, 9; weight 6 lb 7.5 oz (2935 g).  Placenta status: spontaneously delivered, intact .  Cord:  Cord pH: n/a  Anesthesia: none  Episiotomy: None Lacerations: None Suture Repair: none needed Est. Blood Loss (mL): 50  Mom to postpartum.  Baby to Couplet care / Skin to Skin.  Heather Wilkins 11/30/2016, 11:00 AM

## 2016-11-30 NOTE — Progress Notes (Signed)
To BS via w/c °

## 2016-12-01 MED ORDER — NORETHINDRONE 0.35 MG PO TABS: 1.0000 | ORAL_TABLET | Freq: Every day | ORAL | 11 refills | Status: DC

## 2016-12-01 MED ORDER — IBUPROFEN 600 MG PO TABS: 600.0000 mg | ORAL_TABLET | Freq: Four times a day (QID) | ORAL | 0 refills | Status: DC

## 2016-12-01 MED ORDER — ACETAMINOPHEN 325 MG PO TABS: 650.0000 mg | ORAL_TABLET | ORAL | 1 refills | Status: DC | PRN

## 2016-12-01 MED ORDER — SENNOSIDES-DOCUSATE SODIUM 8.6-50 MG PO TABS: 2.0000 | ORAL_TABLET | Freq: Every evening | ORAL | 0 refills | Status: DC | PRN

## 2016-12-01 NOTE — Progress Notes (Signed)
POSTPARTUM PROGRESS NOTE  Post Partum Day 1 Subjective:  Heather Wilkins is a 41 y.o. [id number] 4324w1d s/p SVD.  No acute events overnight.  Pt denies problems with ambulating, voiding or po intake.  She denies nausea or vomiting.  Pain is moderately controlled.  She has had flatus. She has not had bowel movement.  Lochia Small.   Objective: Blood pressure 119/67, pulse 73, temperature 98.3 F (36.8 C), temperature source Oral, resp. rate 18, height 5\' 4"  (1.626 m), weight 186 lb (84.4 kg), last menstrual period 03/08/2016, unknown if currently breastfeeding.  Physical Exam:  General: alert, cooperative and no distress Lochia:normal flow Chest: no respiratory distress Heart:regular rate, distal pulses intact Abdomen: soft, nontender,  Uterine Fundus: firm, appropriately tender DVT Evaluation: No calf swelling or tenderness Extremities: trace edema   Recent Labs  11/30/16 0528  HGB 12.2  HCT 35.2*    Assessment/Plan:  ASSESSMENT: Heather Wilkins is a 41 y.o. [id number] 124w1d s/p SVD  Plan for discharge tomorrow Breast feeding   LOS: 1 day   Les Pouicholas SchenkMD 12/01/2016, 9:44 AM

## 2016-12-01 NOTE — Discharge Summary (Deleted)
OB Discharge Summary     Patient Name: Heather Wilkins Name DOB: 03/07/1976 MRN: [id number]  Date of admission: 11/30/2016 Delivering MD: Wyvonnia DuskyLAWSON, MARIE D   Date of discharge: 12/01/2016  Admitting diagnosis: 38 weeks and having contractions Intrauterine pregnancy: 385w1d     Secondary diagnosis:  Active Problems:   Normal labor  Additional problems: GBS positive     Discharge diagnosis: Term Pregnancy Delivered                                                                                                Post partum procedures:none  Augmentation: none  Complications: None  Hospital course:  Onset of Labor With Vaginal Delivery     41 y.o. yo [id number] at 445w1d was admitted in Active Labor on 11/30/2016. Patient had an uncomplicated labor course as follows:  Membrane Rupture Time/Date: 9:14 AM ,11/30/2016   Intrapartum Procedures: Episiotomy: None [1]                                         Lacerations:  None [1]  Patient had a delivery of a Viable infant. 11/30/2016  Information for the patient's newborn:  Selena BattenZaragoza, Boy Shaday [[id number]][[id number]]  Delivery Method: Vaginal, Spontaneous Delivery (Filed from Delivery Summary)    Pateint had an uncomplicated postpartum course.  She is ambulating, tolerating a regular diet, passing flatus, and urinating well. Patient is discharged home in stable condition on 12/01/16.   Physical exam  Vitals:   11/30/16 1100 11/30/16 1600 12/01/16 0000 12/01/16 0636  BP: 138/72 120/68 129/84 119/67  Pulse: 95 72 71 73  Resp: 18 18 18 18   Temp: 98.2 F (36.8 C) 97.7 F (36.5 C) 98.6 F (37 C) 98.3 F (36.8 C)  TempSrc: Oral Oral Oral Oral  Weight:      Height:       General: alert Lochia: appropriate Uterine Fundus: firm Incision: N/A DVT Evaluation: No evidence of DVT seen on physical exam. Labs: Lab Results  Component Value Date   WBC 12.2 (H) 11/30/2016   HGB 12.2 11/30/2016   HCT 35.2 (L) 11/30/2016   MCV 92.6 11/30/2016   PLT 218 11/30/2016    No flowsheet data found.  Discharge instruction: per After Visit Summary and "Baby and Me Booklet".  After visit meds:  Allergies as of 12/01/2016   No Known Allergies     Medication List    TAKE these medications   acetaminophen 325 MG tablet Commonly known as:  TYLENOL Take 2 tablets (650 mg total) by mouth every 4 (four) hours as needed (for pain scale < 4).   ibuprofen 600 MG tablet Commonly known as:  ADVIL,MOTRIN Take 1 tablet (600 mg total) by mouth every 6 (six) hours.   norethindrone 0.35 MG tablet Commonly known as:  ORTHO MICRONOR Take 1 tablet (0.35 mg total) by mouth daily.   PNV PO Take 1 tablet by mouth daily.   senna-docusate 8.6-50 MG tablet Commonly known as:  Senokot-S Take 2 tablets by mouth  at bedtime as needed for mild constipation (as needed for constipation).       Diet: routine diet  Activity: Advance as tolerated. Pelvic rest for 6 weeks.   Outpatient follow up:6 weeks Follow up Appt:No future appointments. Follow up Visit:No Follow-up on file. Follow-up Information    [facility] Department of Public Health Follow up.   Specialty:  Home Health Services Why:  4 to 6 weeks postpartum Contact information: [address] [place] [address] [phone]        [facility] [place] MATERNITY ADMISSIONS Follow up.   Why:  as needed for emergencies Contact information: [address] [id number] mc [place] [address] [phone]          Postpartum contraception: Progesterone only pills and IUD Paragard  Newborn Data: Live born female  Birth Weight: 6 lb 7.5 oz (2935 g) APGAR: 9, 9  Baby Feeding: Bottle and Breast Disposition:home with mother   12/01/2016 Nehemiah Settle do Silvio Clayman, MD PGY1

## 2016-12-01 NOTE — Lactation Note (Signed)
This note was copied from a baby's chart. Lactation Consultation Note  P4, Experienced w/ breastfeeding. Baby 31 hours old and spitty per mother. Recently bf for 10 min and is sleeping. Discussed waking techniques and encouraged mother to work on longer feedings. Mom encouraged to feed baby 8-12 times/24 hours and with feeding cues.  Provided mother with manual hand pump and suggest she call if she needs further assistance.    Patient Name: Boy Kirk Ruthsnabel Murrell Today's Date: 12/01/2016 Reason for consult: Follow-up assessment   Maternal Data    Feeding Feeding Type: Breast Fed Length of feed: 10 min  LATCH Score/Interventions                      Lactation Tools Discussed/Used     Consult Status Consult Status: PRN    Hardie PulleyBerkelhammer, Alynah Schone Boschen 12/01/2016, 5:02 PM

## 2016-12-01 NOTE — Lactation Note (Signed)
This note was copied from a baby's chart. Lactation Consultation Note  Patient Name: Boy Kirk Ruthsnabel Fontanilla Today's Date: 12/01/2016 Reason for consult: Follow-up assessment Baby at 32 hr of life. Upon entry baby was sleeping in mom's arms. Mom reports baby just came off the breast. She denies breast or nipple pain, voiced no concerns. Discussed baby behavior, feeding frequency, baby belly size, voids, wt loss, breast changes, and nipple care. Mom stated she can manually express and has spoon at the bedside. Mom is aware of lactation services and support group. She will call as needed.     Maternal Data    Feeding Feeding Type: Breast Fed Length of feed: 15 min  LATCH Score/Interventions Latch: Grasps breast easily, tongue down, lips flanged, rhythmical sucking. (per mom)  Audible Swallowing: A few with stimulation (per mom) Intervention(s): Skin to skin;Hand expression Intervention(s): Alternate breast massage  Type of Nipple: Everted at rest and after stimulation  Comfort (Breast/Nipple): Soft / non-tender     Hold (Positioning): No assistance needed to correctly position infant at breast. (per mom)  LATCH Score: 9  Lactation Tools Discussed/Used WIC Program: Yes   Consult Status Consult Status: PRN    Rulon Eisenmengerlizabeth E Alonzo Owczarzak 12/01/2016, 5:52 PM

## 2016-12-01 NOTE — Lactation Note (Signed)
This note was copied from a baby's chart. Lactation Consultation Note Spanish mom speaks English, denies need for interpreter. Wanted LC information in [place]. Experienced BF mom, but the youngest is 39 years old, the oldest 622 yrs old. Mom BF all 3 children for 1 yr each w/o difficulty. Mom plans to Bf and formula feed. Encouraged not to introduce formula until after 2 weeks to allow BM established.  Educated newborn feeding behavior, STS, I&O, cluster feeding,LEAD, supply and demand.  Mom demonstrated hand expression w/colostrum pour out of breast. Mom has "V" shaped breast w/2 fingers width between breast. Short shaft nipple at the tip of breast compressible.  Baby very spitty, spitting up clear fluid. Dicussed lack of interest for feeding is normal during this time until stomach had rid of mucous. Encouraged to try to BF every 2-3 hrs. And cueing.discussed safety during that time. Gave baby to mom to encouraged keep baby head elevated a since mom was awake. RN came into rm. Informed of baby spitty.  Mom encouraged to feed baby 8-12 times/24 hours and with feeding cues.  WH/LC brochure given w/resources, support groups and LC services. Patient Name: Heather Wilkins Today's Date: 12/01/2016 Reason for consult: Initial assessment   Maternal Data Has patient been taught Hand Expression?: Yes Does the patient have breastfeeding experience prior to this delivery?: Yes  Feeding Feeding Type: Breast Fed Length of feed: 20 min  LATCH Score/Interventions Latch: Too sleepy or reluctant, no latch achieved, no sucking elicited. Intervention(s): Skin to skin;Teach feeding cues;Waking techniques  Audible Swallowing: None Intervention(s): Skin to skin;Hand expression  Type of Nipple: Everted at rest and after stimulation (short shaft)  Comfort (Breast/Nipple): Soft / non-tender           Lactation Tools Discussed/Used     Consult Status Consult Status: Follow-up Date: 12/01/16 (in  pm) Follow-up type: In-patient    Darcus Edds, Diamond NickelLAURA G 12/01/2016, 3:22 AM

## 2016-12-01 NOTE — Progress Notes (Signed)
Initial visit with Xochil to introduce spiritual care services and offer support upon the visit of baby KalamaMateo.  Daisie shares that she is doing well and hopes to go home soon.  I informed her that I'd received a request to offer information about Advance Directives and she did not recall making such a statement.  I provided brief education about Living Will and Healtcare Power of Attorney documents.  Konnie has adult children and her parents are living, but she is not married.  We discussed how these decisions would be made if she did not have completed documents.  She does not desire to complete the documents at this time.  Please page as further needs arise.  Maryanna ShapeAmanda M. Carley Hammedavee Lomax, M.Div. [facility] Chaplain Pager [phone] Office [phone]

## 2016-12-02 NOTE — Lactation Note (Signed)
This note was copied from a baby's chart. Lactation Consultation Note  Patient Name: Heather Wilkins Today's Date: 12/02/2016  Mom seems surprised that her milk has not come to volume, yet. She feels that her milk had come in on the 2nd day with her previous children. Mom also notes that with her previous children, she exclusively breast fed and did not have to give formula.   Mom noted to have plenty of colostrum w/breast exam. Mom fed infant recently, so she has my # to call for assist w/next feeding.  Lurline HareRichey, [name] [name] 12/02/2016, 10:33 AM

## 2016-12-02 NOTE — Discharge Summary (Signed)
OB Discharge Summary     Patient Name: Heather Wilkins DOB: 06/03/1976 MRN: [id number]  Date of admission: 11/30/2016 Delivering MD: Wyvonnia DuskyLAWSON, MARIE D   Date of discharge: 12/02/2016  Admitting diagnosis: 38 weeks and having contractions Intrauterine pregnancy: 491w1d     Secondary diagnosis:  Active Problems:   Normal labor  Additional problems: Occasional elevations in BP >140/90s     Discharge diagnosis: Term Pregnancy Delivered                                                                                                Post partum procedures:None  Augmentation: None  Complications: None  Hospital course:  Onset of Labor With Vaginal Delivery     41 y.o. yo [id number] at 431w1d was admitted in Latent Labor on 11/30/2016. Patient had an uncomplicated labor course as follows:  Membrane Rupture Time/Date: 9:14 AM ,11/30/2016   Intrapartum Procedures: Episiotomy: None [1]                                         Lacerations:  None [1]  Patient had a delivery of a Viable infant. 11/30/2016  Information for the patient's newborn:  Selena BattenZaragoza, Boy Nickole [[id number]][[id number]]  Delivery Method: Vaginal, Spontaneous Delivery (Filed from Delivery Summary)    Pateint had an uncomplicated postpartum course.  She is ambulating, tolerating a regular diet, passing flatus, and urinating well. Patient is discharged home in stable condition on 12/02/16.   Physical exam  Vitals:   12/01/16 0000 12/01/16 0636 12/01/16 1740 12/02/16 0550  BP: 129/84 119/67 126/70 126/79  Pulse: 71 73 79 83  Resp: 18 18 18 18   Temp: 98.6 F (37 C) 98.3 F (36.8 C) 98.4 F (36.9 C) 97.7 F (36.5 C)  TempSrc: Oral Oral Oral Oral  Weight:      Height:       General: alert, cooperative and no distress Lochia: appropriate Uterine Fundus: firm Incision: Healing well with no significant drainage DVT Evaluation: No evidence of DVT seen on physical exam. Labs: Lab Results  Component Value Date   WBC 12.2 (H) 11/30/2016   HGB 12.2 11/30/2016   HCT 35.2 (L) 11/30/2016   MCV 92.6 11/30/2016   PLT 218 11/30/2016   No flowsheet data found.  Discharge instruction: per After Visit Summary and "Baby and Me Booklet".  After visit meds:  Allergies as of 12/02/2016   No Known Allergies     Medication List    TAKE these medications   acetaminophen 325 MG tablet Commonly known as:  TYLENOL Take 2 tablets (650 mg total) by mouth every 4 (four) hours as needed (for pain scale < 4).   ibuprofen 600 MG tablet Commonly known as:  ADVIL,MOTRIN Take 1 tablet (600 mg total) by mouth every 6 (six) hours.   norethindrone 0.35 MG tablet Commonly known as:  ORTHO MICRONOR Take 1 tablet (0.35 mg total) by mouth daily.   PNV PO Take 1 tablet by mouth daily.   senna-docusate 8.6-50 MG  tablet Commonly known as:  Senokot-S Take 2 tablets by mouth at bedtime as needed for mild constipation (as needed for constipation).       Diet: routine diet  Activity: Advance as tolerated. Pelvic rest for 6 weeks.   Outpatient follow up:6 weeks Follow up Appt:No future appointments. Follow up Visit:No Follow-up on file.  Postpartum contraception: Paraguard  Newborn Data: Live born female  Birth Weight: 6 lb 7.5 oz (2935 g) APGAR: 9, 9  Baby Feeding: Breast Disposition:home with mother   12/02/2016 Wendee Beavers, DO, PGY-1  Patient was seen and examined by me also Agree with note Vitals stable Labs stable Fundus firm, lochia within normal limits Perineum healing Ext WNL  Ready for discharge  Heather Wilkins, CNM

## 2016-12-02 NOTE — Lactation Note (Signed)
This note was copied from a baby's chart. Lactation Consultation Note Experienced BF mom BF baby, baby had 9% weight loss in 38 hrs. Mom has been BF well per mom. Stated baby is feeding a lot. Moms breast are "V" shaped, hand expression colostrum. Discussed weight loss, supplementing options, and pumping. Hand expressed 2 ml colostrum via spoon. Reviewed how much baby should be supplemented at age. Mom in agreement.  Baby was very spitty first 24 hrs of age. Mom stated baby has started spitting again during the night. Noted yellow/orange colostrum on baby blanket. Noted baby spitting 2 times while in rm.  Baby BF well heard immediately swallowing. Baby satisfied after BF.  Mom shown how to use DEBP & how to disassemble, clean, & reassemble parts. Mom knows to pump q3h for 15-20 min. Give to baby for supplementing, also give formula to equal amount needed.  Mom speaks English denies [employer] tradernterpreter. Attempted to teach mom how to syring feed formula. Baby wasn't interested in suckling on gloved finger. Mom stated she would give bottles.  Discussed moms breast when filling, may not need to supplement w/formula. Will evaluate at that time.  Patient Name: Heather Wilkins Today's Date: 12/02/2016 Reason for consult: Follow-up assessment;Infant weight loss;Infant < 6lbs   Maternal Data    Feeding Feeding Type: Breast Milk Length of feed: 20 min  LATCH Score/Interventions Latch: Grasps breast easily, tongue down, lips flanged, rhythmical sucking.  Audible Swallowing: Spontaneous and intermittent Intervention(s): Hand expression  Type of Nipple: Everted at rest and after stimulation  Comfort (Breast/Nipple): Soft / non-tender     Hold (Positioning): Assistance needed to correctly position infant at breast and maintain latch. Intervention(s): Breastfeeding basics reviewed;Support Pillows;Position options;Skin to skin  LATCH Score: 9  Lactation Tools Discussed/Used Tools: Pump Breast pump  type: Double-Electric Breast Pump Pump Review: Setup, frequency, and cleaning;Milk Storage Initiated by:: Peri JeffersonL. Yatziry Deakins RN IBCLC Date initiated:: 12/02/16   Consult Status Consult Status: Follow-up Date: 12/02/16 Follow-up type: In-patient    Charyl DancerCARVER, Heather Wilkins 12/02/2016, 4:51 AM

## 2016-12-02 NOTE — Lactation Note (Addendum)
This note was copied from a baby's chart. Lactation Consultation Note  Patient Name: Heather Wilkins Today's Date: 12/02/2016 Reason for consult: Follow-up assessment  Heather Wilkins is nursing well (continuous swallows noted). However, with pre- & post-weights, only 15mL transferred & infant still acting hungry. Supplementation at the breast attempted, but was not successful. Paced bottle-feeding taught to parents.  Plan 1. Feed Heather Wilkins at both breasts. Supplement with EBM or formula if he is still acting hungry. 2. Pump, especially when formula is provided.   Mom reports that her breasts feel a little bit heavier. [facility]. Mom using size 21 flanges. Peds appt tomorrow.  Heather Wilkins, Heather Wilkins 12/02/2016, 12:20 PM

## 2016-12-02 NOTE — Discharge Instructions (Signed)

## 2018-09-29 NOTE — L&D Delivery Note (Signed)
OB/GYN Faculty Practice Delivery Note  Heather Wilkins is a 43 y.o. [id number] s/p vaginal delivery at [redacted]w[redacted]d. She was admitted for IOL 2/2 A2GDM.   ROM: 5h 68m with clear fluid GBS Status: negative Maximum Maternal Temperature: 98.7  Labor Progress: She was admitted and cervical ripening was started with cytotec. A FB was subsequently able to be placed. After the fB came out, she was started on low-dose pitocin, and was able to be AROMed shortly after. She progressed to complete and delivered shortly after.  Delivery Date/Time: 07/27/19, 0032 Delivery: Called to room and patient was complete and pushing. Head delivered straight OA. No nuchal cord present. Shoulder and body delivered precipitously in the usual fashion. Infant with spontaneous cry, placed on mother's abdomen, dried and stimulated. Cord clamped x 2 after 1-minute delay, and cut by FOB under my direct supervision. Cord blood drawn. Placenta delivered spontaneously with gentle cord traction. Fundus firm with massage and Pitocin. Labia, perineum, vagina, and cervix were inspected, and a right periurethral abrasion and perineal abrasion were noted, both hemostatic and not requiring repair.   Placenta: intact, 3 vessel cord Complications: None Lacerations: right periurethral abrasion, perineal abrasion, both hemostatic EBL: 250 mL Analgesia: epidural  Postpartum Planning [x]  message to sent to schedule follow-up  [x]  vaccines UTD  Infant: female  APGARs 9 & 9  weight per medical record  Merilyn Baba, DO OB/GYN Fellow, Faculty Practice

## 2018-12-21 ENCOUNTER — Other Ambulatory Visit: Payer: Self-pay

## 2018-12-28 ENCOUNTER — Encounter: Payer: Self-pay | Admitting: Family Medicine

## 2018-12-28 ENCOUNTER — Telehealth: Payer: Self-pay | Admitting: Family Medicine

## 2018-12-28 NOTE — Telephone Encounter (Signed)
Telephone Note  Called patient with Dr. Pollie Meyer in order to discuss if patient is having any red flag symptoms that would need to come in today.  Given that patient is advanced maternal age, she would need to be seen with high risk clinic as we do not take care of patients >41 years of age.   She reports that she does not have any acute need to come in and is having normal pregnancy thus far. She reports that she has an ultrasound scheduled for later today.    Patient admits that she was unaware that she had an appointment today as she believed that she had canceled this.  Patient instructed to call adopt-a-mom so that she can be seen as soon as possible with high risk clinic.  [place] Aragon Scarantino, DO PGY-2, [facility]

## 2018-12-30 ENCOUNTER — Telehealth: Payer: Self-pay | Admitting: Family Medicine

## 2018-12-30 NOTE — Telephone Encounter (Signed)
Leandra called in to schedule an appointment for a patient with the adopt-a-mom program. Informed the date and time.

## 2019-01-11 ENCOUNTER — Encounter: Payer: Self-pay | Admitting: Obstetrics and Gynecology

## 2019-02-08 ENCOUNTER — Ambulatory Visit (INDEPENDENT_AMBULATORY_CARE_PROVIDER_SITE_OTHER): Payer: Self-pay

## 2019-02-08 ENCOUNTER — Other Ambulatory Visit: Payer: Self-pay

## 2019-02-08 DIAGNOSIS — O0990 Supervision of high risk pregnancy, unspecified, unspecified trimester: Secondary | ICD-10-CM

## 2019-02-08 DIAGNOSIS — O09529 Supervision of elderly multigravida, unspecified trimester: Secondary | ICD-10-CM

## 2019-02-08 NOTE — Progress Notes (Signed)
I connected with  Heather Wilkins on 02/08/19 at  2:15 PM EDT by telephone and verified that I am speaking with the correct person using two identifiers.   I discussed the limitations, risks, security and privacy concerns of performing an evaluation and management service by telephone and the availability of in person appointments. I also discussed with the patient that there may be a patient responsible charge related to this service. The patient expressed understanding and agreed to proceed.  Obtained pt hx today.  Pt denies any pain or bleeding today.  Medications reconciled.  Pt informed that we will do a pap smear, offer genetic screening, obtain HbgA1C and OB prenatal, as well as a urine culture.  I also explained to the pt that we will get her linked into MyChart at her visit.  We will also give her a BP cuff and explain how to use it and submit value in Babyscripts.  Pt advised on how to download Babyscripts and was able to place a BP value in app successfully.  Anatomy US scheduled June 9th @ 1000.  Pt notified of [place] and NEW OB appt.  Pt verbalized understanding to all that has been discussed.   Ralene Bathe, RN 02/08/2019  2:31 PM

## 2019-02-09 NOTE — Progress Notes (Signed)
Chart reviewed for nurse visit. Agree with plan of care.  -will plan for early one hour at NOB visit and start on baby ASA Marylene Land, CNM 02/09/2019 12:36 PM

## 2019-03-01 ENCOUNTER — Other Ambulatory Visit (HOSPITAL_COMMUNITY)
Admission: RE | Admit: 2019-03-01 | Discharge: 2019-03-01 | Disposition: A | Discharge status: Home / Self Care | Payer: Self-pay | Source: Ambulatory Visit | Attending: Student | Admitting: Student

## 2019-03-01 ENCOUNTER — Other Ambulatory Visit: Payer: Self-pay

## 2019-03-01 ENCOUNTER — Encounter: Payer: Self-pay | Admitting: Student

## 2019-03-01 ENCOUNTER — Ambulatory Visit (INDEPENDENT_AMBULATORY_CARE_PROVIDER_SITE_OTHER): Payer: Self-pay | Admitting: Student

## 2019-03-01 DIAGNOSIS — Z1151 Encounter for screening for human papillomavirus (HPV): Secondary | ICD-10-CM

## 2019-03-01 DIAGNOSIS — Z113 Encounter for screening for infections with a predominantly sexual mode of transmission: Secondary | ICD-10-CM

## 2019-03-01 DIAGNOSIS — Z124 Encounter for screening for malignant neoplasm of cervix: Secondary | ICD-10-CM

## 2019-03-01 DIAGNOSIS — Z3A18 18 weeks gestation of pregnancy: Secondary | ICD-10-CM

## 2019-03-01 DIAGNOSIS — O0990 Supervision of high risk pregnancy, unspecified, unspecified trimester: Secondary | ICD-10-CM | POA: Insufficient documentation

## 2019-03-01 DIAGNOSIS — Z3492 Encounter for supervision of normal pregnancy, unspecified, second trimester: Secondary | ICD-10-CM

## 2019-03-01 MED ORDER — ASPIRIN 81 MG PO CHEW: 81.0000 mg | CHEWABLE_TABLET | Freq: Every day | ORAL | 5 refills | Status: DC

## 2019-03-01 NOTE — Progress Notes (Signed)
BP Cuff given, Pt also has My Chart App & Webex.

## 2019-03-01 NOTE — Assessment & Plan Note (Signed)
  Guidelines for Antenatal Testing and Sonography  (with updated ICD-10 codes)  Updated  June 27, 2018 with Dr. Noralee Space  INDICATION U/S 2 X week NST/AFI  or full BPP wkly DELIVERY  Advanced Maternal Age > 41 y.o. - O69.519 (primip), O09.529 (multip) 20-24-28-32-36 36 39 to 40

## 2019-03-01 NOTE — Patient Instructions (Addendum)
Segundo trimestre de embarazo Second Trimester of Pregnancy  El segundo trimestre va desde la semana14 hasta la 27 (desde el mes 4 hasta el 6). Este suele ser el momento en el que mejor se siente. En general, las nuseas matutinas han disminuido o han desaparecido completamente. Tendr ms energa y podr aumentarle el apetito. El beb en gestacin se desarrolla rpidamente. Hacia el final del sexto mes, el beb mide aproximadamente 9 pulgadas (23 cm) y pesa alrededor de 1 libras (700 g). Es probable que sienta al beb moverse entre las 18 y 20 semanas del embarazo. Siga estas indicaciones en su casa: Medicamentos  Tome los medicamentos de venta libre y los recetados solamente como se lo haya indicado el mdico. Algunos medicamentos son seguros para tomar durante el embarazo y otros no lo son.  Tome vitaminas prenatales que contengan por lo menos 600microgramos (?g) de cido flico.  Si tiene dificultad para mover el intestino (estreimiento), tome un medicamento para ablandar las heces (laxante) si su mdico se lo autoriza. Comida y bebida   Ingiera alimentos saludables de manera regular.  No coma carne cruda ni quesos sin cocinar.  Si obtiene poca cantidad de calcio de los alimentos que ingiere, consulte a su mdico sobre la posibilidad de tomar un suplemento diario de calcio.  Evite el consumo de alimentos ricos en grasas y azcares, como los alimentos fritos y los dulces.  Si tiene malestar estomacal (nuseas) o devuelve (vomita): ? Ingiera 4 o 5comidas pequeas por da en lugar de 3abundantes. ? Intente comer algunas galletitas saladas. ? Beba lquidos entre las comidas, en lugar de hacerlo durante estas.  Para evitar el estreimiento: ? Consuma alimentos ricos en fibra, como frutas y verduras frescas, cereales integrales y frijoles. ? Beba suficiente lquido para mantener el pis (orina) claro o de color amarillo plido. Actividad  Haga ejercicios solamente como se lo haya  indicado el mdico. Interrumpa la actividad fsica si comienza a tener calambres.  No haga ejercicio si hace demasiado calor, hay demasiada humedad o se encuentra en un lugar de mucha altura (altitud alta).  Evite levantar pesos excesivos.  Use zapatos con tacones bajos. Mantenga una buena postura al sentarse y pararse.  Puede continuar teniendo relaciones sexuales, a menos que el mdico le indique lo contrario. Alivio del dolor y del malestar  Use un sostn que le brinde buen soporte si sus mamas estn sensibles.  Dese baos de asiento con agua tibia para aliviar el dolor o las molestias causadas por las hemorroides. Use una crema para las hemorroides si el mdico la autoriza.  Descanse con las piernas elevadas si tiene calambres o dolor de cintura.  Si desarrolla venas hinchadas y abultadas (vrices) en las piernas: ? Use medias de compresin o medias de descanso como se lo haya indicado el mdico. ? Levante (eleve) los pies durante 15minutos, 3 o 4veces por da. ? Limite el consumo de sal en sus alimentos. Cuidado prenatal  Escriba sus preguntas. Llvelas cuando concurra a las visitas prenatales.  Concurra a todas las visitas prenatales como se lo haya indicado el mdico. Esto es importante. Seguridad  Colquese el cinturn de seguridad cuando conduzca.  Haga una lista de los nmeros de telfono de emergencia, que incluya los nmeros de telfono de familiares, amigos, el hospital, as como los departamentos de polica y bomberos. Instrucciones generales  Consulte a su mdico sobre los alimentos que debe comer o pdale que la ayude a encontrar a quien pueda aconsejarla si necesita ese servicio.    Consulte a su mdico acerca de dnde se dictan clases prenatales cerca de donde vive. Comience las clases antes del mes 6 de embarazo.  No se d baos de inmersin en agua caliente, baos turcos ni saunas.  No se haga duchas vaginales ni use tampones o toallas higinicas perfumadas.   No mantenga las piernas cruzadas durante mucho tiempo.  Vaya al dentista si an no lo hizo. Use un cepillo de cerdas suaves para cepillarse los dientes. Psese el hilo dental suavemente.  No fume, no consuma hierbas ni beba alcohol. No tome frmacos que el mdico no haya autorizado.  No consuma ningn producto que contenga nicotina o tabaco, como cigarrillos y cigarrillos electrnicos. Si necesita ayuda para dejar de fumar, consulte al mdico.  Evite el contacto con las bandejas sanitarias de los gatos y la tierra que estos animales usan. Estos elementos contienen bacterias que pueden causar defectos congnitos al beb y la posible prdida del beb (aborto espontneo) o la muerte fetal. Comunquese con un mdico si:  Tiene clicos leves o siente presin en la parte baja del vientre.  Tiene dolor al hacer pis (orinar).  Advierte un lquido con olor ftido que proviene de la vagina.  Tiene malestar estomacal (nuseas), devuelve (vomita) o tiene deposiciones acuosas (diarrea).  Sufre un dolor persistente en el abdomen.  Siente mareos. Solicite ayuda de inmediato si:  Tiene fiebre.  Tiene una prdida de lquido por la vagina.  Tiene sangrado o pequeas prdidas vaginales.  Siente dolor intenso o clicos en el abdomen.  Sube o baja de peso rpidamente.  Tiene dificultades para recuperar el aliento y siente dolor en el pecho.  Sbitamente se le hinchan mucho el rostro, las manos, los tobillos, los pies o las piernas.  No ha sentido los movimientos del beb durante una hora.  Siente un dolor de cabeza intenso que no se alivia al tomar medicamentos.  Tiene dificultad para ver. Resumen  El segundo trimestre va desde la semana14 hasta la 27, desde el mes 4 hasta el 6. Este suele ser el momento en el que mejor se siente.  Para cuidarse y cuidar a su beb en gestacin, debe comer alimentos saludables, tomar medicamentos solamente si su mdico le indica que lo haga y hacer  actividades que sean seguras para usted y su beb.  Llame al mdico si se enferma o si nota algo inusual acerca de su embarazo. Tambin llame al mdico si necesita ayuda para saber qu alimentos debe comer o si quiere saber qu actividades puede realizar de forma segura. Esta informacin no tiene como fin reemplazar el consejo del mdico. Asegrese de hacerle al mdico cualquier pregunta que tenga. Document Released: 05/18/2013 Document Revised: 06/10/2017 Document Reviewed: 06/10/2017 Elsevier Interactive Patient Education  2019 Elsevier Inc.  

## 2019-03-01 NOTE — Progress Notes (Signed)
  Subjective:    Heather Wilkins is being seen today for her first obstetrical visit.  This is a planned pregnancy. She is at [redacted]w[redacted]d gestation. Her obstetrical history is significant for nothing. Relationship with FOB: married but he works out of town. . Patient does intend to breast feed. Pregnancy history fully reviewed.  Patient reports no complaints.  Review of Systems:   Review of Systems  Constitutional: Negative.   HENT: Negative.   Cardiovascular: Negative.   Gastrointestinal: Negative.   Genitourinary: Negative.   Musculoskeletal: Negative.   Neurological: Negative.   Psychiatric/Behavioral: Negative.     Objective:     BP 126/79   Pulse 69   Temp 98.7 F (37.1 C)   Wt 169 lb 1.6 oz (76.7 kg)   LMP 10/26/2018 (Exact Date)   BMI 29.03 kg/m  Physical Exam  Constitutional: She appears well-developed.  HENT:  Head: Normocephalic.  Respiratory: Effort normal.  GI: Soft.  Musculoskeletal: Normal range of motion.  Neurological: She is alert.  Skin: Skin is warm and dry.    Exam    Assessment:    Pregnancy: [id number] Patient Active Problem List   Diagnosis Date Noted  . Supervision of high risk pregnancy, antepartum 02/08/2019  . AMA (advanced maternal age) multigravida 35+ 02/08/2019       Plan:     Initial labs drawn. Prenatal vitamins. Problem list reviewed and updated. AFP3 discussed: declined. Role of ultrasound in pregnancy discussed; fetal survey: ordered. Amniocentesis discussed: not indicated. Follow up in 3 weeks to make sure that she has BP cuff -AMA; will need monthly growth [place] -Explained aspirin for AMA, RX sent -Desires BTL; explained other options if BTL not available due to insurance. Discussed IUD options in detail.   -Early 1 hour today as patient is high risk -[place] to be ordered at [facility]; given that patient is [facility]-Mom, she needs her monthly growth [place] at [facility] as well  50% of 30 min visit spent on counseling and  coordination of care.  -Patient has BabyRX; was shown how to use cuff and has MyChart App -Needs urine OB culture at visit   Charlesetta Garibaldi [facility] 03/01/2019

## 2019-03-02 LAB — OBSTETRIC PANEL, INCLUDING HIV
Antibody Screen: NEGATIVE
Basophils Absolute: 0.1 10*3/uL (ref 0.0–0.2)
Basos: 1 %
EOS (ABSOLUTE): 0.1 10*3/uL (ref 0.0–0.4)
Eos: 1 %
HIV Screen 4th Generation wRfx: NONREACTIVE
Hematocrit: 32.6 % — ABNORMAL LOW (ref 34.0–46.6)
Hemoglobin: 10.8 g/dL — ABNORMAL LOW (ref 11.1–15.9)
Hepatitis B Surface Ag: NEGATIVE
Immature Grans (Abs): 0.2 10*3/uL — ABNORMAL HIGH (ref 0.0–0.1)
Immature Granulocytes: 2 %
Lymphocytes Absolute: 2.1 10*3/uL (ref 0.7–3.1)
Lymphs: 20 %
MCH: 30.7 pg (ref 26.6–33.0)
MCHC: 33.1 g/dL (ref 31.5–35.7)
MCV: 93 fL (ref 79–97)
Monocytes Absolute: 0.5 10*3/uL (ref 0.1–0.9)
Monocytes: 5 %
Neutrophils Absolute: 7.7 10*3/uL — ABNORMAL HIGH (ref 1.4–7.0)
Neutrophils: 71 %
Platelets: 249 10*3/uL (ref 150–450)
RBC: 3.52 x10E6/uL — ABNORMAL LOW (ref 3.77–5.28)
RDW: 13.6 % (ref 11.7–15.4)
RPR Ser Ql: NONREACTIVE
Rh Factor: POSITIVE
Rubella Antibodies, IGG: 1.69 index (ref >0.99)
WBC: 10.5 10*3/uL (ref 3.4–10.8)

## 2019-03-02 LAB — GLUCOSE TOLERANCE, 1 HOUR: Glucose, 1Hr PP: 161 mg/dL (ref 65–199)

## 2019-03-04 LAB — CYTOLOGY - PAP
Chlamydia: NEGATIVE
Diagnosis: NEGATIVE
HPV: NOT DETECTED
Neisseria Gonorrhea: NEGATIVE

## 2019-03-08 ENCOUNTER — Ambulatory Visit (HOSPITAL_COMMUNITY): Payer: Self-pay

## 2019-03-08 ENCOUNTER — Other Ambulatory Visit (HOSPITAL_COMMUNITY): Payer: Self-pay

## 2019-03-08 ENCOUNTER — Encounter: Payer: Self-pay | Admitting: *Deleted

## 2019-03-22 ENCOUNTER — Other Ambulatory Visit: Payer: Self-pay

## 2019-03-22 ENCOUNTER — Ambulatory Visit: Payer: Self-pay | Admitting: Obstetrics and Gynecology

## 2019-03-22 DIAGNOSIS — Z5329 Procedure and treatment not carried out because of patient's decision for other reasons: Secondary | ICD-10-CM

## 2019-03-22 DIAGNOSIS — Z91199 Patient's noncompliance with other medical treatment and regimen due to unspecified reason: Secondary | ICD-10-CM

## 2019-03-22 NOTE — Progress Notes (Signed)
Attempted to contact pt at the scheduled time of appointment and 15 minutes after. Pt was left a voicemail informing her of these attempts. Pt was encouraged to give the office a call.

## 2019-03-23 ENCOUNTER — Encounter: Payer: Self-pay | Admitting: *Deleted

## 2019-03-23 ENCOUNTER — Encounter: Payer: Self-pay | Admitting: Obstetrics and Gynecology

## 2019-03-23 ENCOUNTER — Telehealth: Payer: Self-pay | Admitting: Obstetrics and Gynecology

## 2019-03-23 NOTE — Telephone Encounter (Signed)
Called patient about her missed appointment on the 23rd. She stated she could not hear when they called her. I asked her if she needed an interpreter. She stated she did not. I sent her the link to get signed up. I was going to wait on the line, but she wanted me to call her back around 3:00 because she was not able to do it right now. I stated I would call her back to make sure she had it downloaded. I expressed the importance of having this app for her visit. She stated she understood.

## 2019-03-24 ENCOUNTER — Telehealth: Payer: Self-pay | Admitting: Obstetrics & Gynecology

## 2019-03-30 ENCOUNTER — Telehealth: Payer: Self-pay | Admitting: Obstetrics & Gynecology

## 2019-03-30 NOTE — Telephone Encounter (Signed)
Called the patient to confirm the mychart visit scheduled for tomorrow. Informed the patient of being signed into the virtual visit 15 minutes prior to time. The patient verbalized understanding. °

## 2019-03-31 ENCOUNTER — Encounter: Payer: Self-pay | Admitting: Obstetrics and Gynecology

## 2019-03-31 ENCOUNTER — Other Ambulatory Visit: Payer: Self-pay

## 2019-03-31 ENCOUNTER — Telehealth (INDEPENDENT_AMBULATORY_CARE_PROVIDER_SITE_OTHER): Payer: Self-pay | Admitting: Obstetrics and Gynecology

## 2019-03-31 DIAGNOSIS — O09522 Supervision of elderly multigravida, second trimester: Secondary | ICD-10-CM

## 2019-03-31 DIAGNOSIS — O0992 Supervision of high risk pregnancy, unspecified, second trimester: Secondary | ICD-10-CM

## 2019-03-31 DIAGNOSIS — O09529 Supervision of elderly multigravida, unspecified trimester: Secondary | ICD-10-CM

## 2019-03-31 DIAGNOSIS — O0990 Supervision of high risk pregnancy, unspecified, unspecified trimester: Secondary | ICD-10-CM

## 2019-03-31 DIAGNOSIS — Z3A22 22 weeks gestation of pregnancy: Secondary | ICD-10-CM

## 2019-03-31 NOTE — Progress Notes (Signed)
   TELEHEALTH OBSTETRICS PRENATAL VIRTUAL VIDEO VISIT ENCOUNTER NOTE  Provider location: Center for [employer] at [place] connected with Rosana Hoes on 03/31/19 at  9:15 AM EDT by MyChart Video Encounter at home and verified that I am speaking with the correct person using two identifiers.   I discussed the limitations, risks, security and privacy concerns of performing an evaluation and management service by telephone and the availability of in person appointments. I also discussed with the patient that there may be a patient responsible charge related to this service. The patient expressed understanding and agreed to proceed. Subjective:  Heather Wilkins is a 43 y.o. G5P4004 at [redacted]w[redacted]d being seen today for ongoing prenatal care.  She is currently monitored for the following issues for this high-risk pregnancy and has Supervision of high risk pregnancy, antepartum and AMA (advanced maternal age) multigravida 35+ on their problem list.  Patient reports no complaints.  Contractions: Not present. Vag. Bleeding: None.  Movement: Present. Denies any leaking of fluid.   The following portions of the patient's history were reviewed and updated as appropriate: allergies, current medications, past family history, past medical history, past social history, past surgical history and problem list.   Objective:   Vitals:   03/31/19 0922  BP: 123/64  Pulse: 77    Fetal Status:     Movement: Present     General:  Alert, oriented and cooperative. Patient is in no acute distress.  Respiratory: Normal respiratory effort, no problems with respiration noted  Mental Status: Normal mood and affect. Normal behavior. Normal judgment and thought content.  Rest of physical exam deferred due to type of encounter  Imaging: No results found.  Assessment and Plan:  Pregnancy: [id number] at [redacted]w[redacted]d 1. Supervision of high risk pregnancy, antepartum Stable Glucola with next OB visit Also needs UC   2. Antepartum multigravida of advanced maternal age Anatomy scan with [facility] ordered Continue with BASA  Preterm labor symptoms and general obstetric precautions including but not limited to vaginal bleeding, contractions, leaking of fluid and fetal movement were reviewed in detail with the patient. I discussed the assessment and treatment plan with the patient. The patient was provided an opportunity to ask questions and all were answered. The patient agreed with the plan and demonstrated an understanding of the instructions. The patient was advised to call back or seek an in-person office evaluation/go to MAU at [facility] for any urgent or concerning symptoms. Please refer to After Visit Summary for other counseling recommendations.   I provided 8 minutes of face-to-face time during this encounter.  Return in about 4 weeks (around 04/28/2019) for OB visit, face to face for glucola.  Future Appointments  Date Time Provider [place]  04/14/2019  2:15 PM Anyanwu, Sallyanne Havers, MD [facility]  04/28/2019  8:35 AM Lavonia Drafts, MD [facility]  05/11/2019  8:40 AM [facility]  05/11/2019  9:15 AM Emily Filbert, MD [facility]  05/26/2019  8:55 AM Lavonia Drafts, MD [place], [place] for [facility], [place]

## 2019-04-14 ENCOUNTER — Telehealth: Payer: Self-pay | Admitting: Obstetrics & Gynecology

## 2019-04-25 ENCOUNTER — Other Ambulatory Visit: Payer: Self-pay | Admitting: *Deleted

## 2019-04-25 DIAGNOSIS — O09529 Supervision of elderly multigravida, unspecified trimester: Secondary | ICD-10-CM

## 2019-04-25 DIAGNOSIS — O0990 Supervision of high risk pregnancy, unspecified, unspecified trimester: Secondary | ICD-10-CM

## 2019-04-28 ENCOUNTER — Ambulatory Visit (INDEPENDENT_AMBULATORY_CARE_PROVIDER_SITE_OTHER): Payer: Self-pay | Admitting: Obstetrics & Gynecology

## 2019-04-28 ENCOUNTER — Other Ambulatory Visit: Payer: Self-pay

## 2019-04-28 ENCOUNTER — Telehealth: Payer: Self-pay | Admitting: Obstetrics & Gynecology

## 2019-04-28 VITALS — BP 125/68 | HR 70 | Temp 98.1°F | Wt 178.4 lb

## 2019-04-28 DIAGNOSIS — O99810 Abnormal glucose complicating pregnancy: Secondary | ICD-10-CM

## 2019-04-28 DIAGNOSIS — O0990 Supervision of high risk pregnancy, unspecified, unspecified trimester: Secondary | ICD-10-CM

## 2019-04-28 DIAGNOSIS — Z3A26 26 weeks gestation of pregnancy: Secondary | ICD-10-CM

## 2019-04-28 DIAGNOSIS — O0992 Supervision of high risk pregnancy, unspecified, second trimester: Secondary | ICD-10-CM

## 2019-04-28 DIAGNOSIS — O09529 Supervision of elderly multigravida, unspecified trimester: Secondary | ICD-10-CM

## 2019-04-28 DIAGNOSIS — Z23 Encounter for immunization: Secondary | ICD-10-CM

## 2019-04-28 DIAGNOSIS — O09522 Supervision of elderly multigravida, second trimester: Secondary | ICD-10-CM

## 2019-04-28 LAB — HEMOGLOBIN A1C
Est. average glucose Bld gHb Est-mCnc: 114 mg/dL
Hgb A1c MFr Bld: 5.6 % (ref 4.8–5.6)

## 2019-04-28 NOTE — Progress Notes (Signed)
   PRENATAL VISIT NOTE  Subjective:  Heather Wilkins is a 43 y.o. G5P4004 at [redacted]w[redacted]d being seen today for ongoing prenatal care.  She is currently monitored for the following issues for this low-risk pregnancy and has Supervision of high risk pregnancy, antepartum and AMA (advanced maternal age) multigravida 35+ on their problem list.  Patient reports no complaints.  Contractions: Not present. Vag. Bleeding: None.  Movement: Present. Denies leaking of fluid.   The following portions of the patient's history were reviewed and updated as appropriate: allergies, current medications, past family history, past medical history, past social history, past surgical history and problem list.   Objective:   Vitals:   04/28/19 1010  BP: 125/68  Pulse: 70  Temp: 98.1 F (36.7 C)  Weight: 178 lb 6.4 oz (80.9 kg)    Fetal Status: Fetal Heart Rate (bpm): 141   Movement: Present     General:  Alert, oriented and cooperative. Patient is in no acute distress.  Skin: Skin is warm and dry. No rash noted.   Cardiovascular: Normal heart rate noted  Respiratory: Normal respiratory effort, no problems with respiration noted  Abdomen: Soft, gravid, appropriate for gestational age.  Pain/Pressure: Absent     Pelvic: Cervical exam deferred        Extremities: Normal range of motion.  Edema: None  Mental Status: Normal mood and affect. Normal behavior. Normal judgment and thought content.   Assessment and Plan:  Pregnancy: [id number] at [redacted]w[redacted]d 1. Supervision of high risk pregnancy, antepartum  2. Antepartum multigravida of advanced maternal age  79. Abnormal glucose tolerance affecting pregnancy, antepartum - she accepted the offer of a dietician consult - Hemoglobin A1c  Preterm labor symptoms and general obstetric precautions including but not limited to vaginal bleeding, contractions, leaking of fluid and fetal movement were reviewed in detail with the patient. Please refer to After Visit Summary for other  counseling recommendations.   No follow-ups on file.  Future Appointments  Date Time Provider [place]  04/28/2019 10:35 AM Emily Filbert, MD [facility]  05/26/2019  8:55 AM Lavonia Drafts, MD [facility]    Emily Filbert, MD

## 2019-04-29 ENCOUNTER — Encounter: Payer: Self-pay | Admitting: Student

## 2019-04-29 DIAGNOSIS — O24419 Gestational diabetes mellitus in pregnancy, unspecified control: Secondary | ICD-10-CM | POA: Insufficient documentation

## 2019-04-29 LAB — CBC
Hematocrit: 31.7 % — ABNORMAL LOW (ref 34.0–46.6)
Hemoglobin: 10.8 g/dL — ABNORMAL LOW (ref 11.1–15.9)
MCH: 32 pg (ref 26.6–33.0)
MCHC: 34.1 g/dL (ref 31.5–35.7)
MCV: 94 fL (ref 79–97)
Platelets: 233 10*3/uL (ref 150–450)
RBC: 3.38 x10E6/uL — ABNORMAL LOW (ref 3.77–5.28)
RDW: 14.1 % (ref 11.7–15.4)
WBC: 11.2 10*3/uL — ABNORMAL HIGH (ref 3.4–10.8)

## 2019-04-29 LAB — GLUCOSE TOLERANCE, 2 HOURS W/ 1HR
Glucose, 1 hour: 197 mg/dL — ABNORMAL HIGH (ref 65–179)
Glucose, 2 hour: 186 mg/dL — ABNORMAL HIGH (ref 65–152)
Glucose, Fasting: 99 mg/dL — ABNORMAL HIGH (ref 65–91)

## 2019-04-29 LAB — RPR: RPR Ser Ql: NONREACTIVE

## 2019-04-29 LAB — HIV ANTIBODY (ROUTINE TESTING W REFLEX): HIV Screen 4th Generation wRfx: NONREACTIVE

## 2019-05-11 ENCOUNTER — Encounter: Payer: Self-pay | Admitting: Obstetrics & Gynecology

## 2019-05-11 ENCOUNTER — Other Ambulatory Visit: Payer: Self-pay

## 2019-05-17 ENCOUNTER — Other Ambulatory Visit: Payer: Self-pay

## 2019-05-25 ENCOUNTER — Telehealth: Payer: Self-pay | Admitting: Obstetrics & Gynecology

## 2019-05-25 NOTE — Telephone Encounter (Signed)
Attempted to contact patient about her appointment on 8/27 @ 8:55. Patient instructed that the appointment is a mychart visit. Patient instructed to download the mychart app if not already done so. Patient instructed to give the office a call with any concerns.

## 2019-05-26 ENCOUNTER — Telehealth (INDEPENDENT_AMBULATORY_CARE_PROVIDER_SITE_OTHER): Payer: Self-pay | Admitting: Obstetrics & Gynecology

## 2019-05-26 ENCOUNTER — Other Ambulatory Visit: Payer: Self-pay

## 2019-05-26 ENCOUNTER — Telehealth: Payer: Self-pay | Admitting: Obstetrics & Gynecology

## 2019-05-26 ENCOUNTER — Encounter: Payer: Self-pay | Admitting: Obstetrics & Gynecology

## 2019-05-26 ENCOUNTER — Telehealth: Payer: Self-pay | Admitting: General Practice

## 2019-05-26 VITALS — BP 118/70 | HR 74

## 2019-05-26 DIAGNOSIS — O24410 Gestational diabetes mellitus in pregnancy, diet controlled: Secondary | ICD-10-CM

## 2019-05-26 DIAGNOSIS — O0993 Supervision of high risk pregnancy, unspecified, third trimester: Secondary | ICD-10-CM

## 2019-05-26 DIAGNOSIS — O09523 Supervision of elderly multigravida, third trimester: Secondary | ICD-10-CM

## 2019-05-26 DIAGNOSIS — O99013 Anemia complicating pregnancy, third trimester: Secondary | ICD-10-CM

## 2019-05-26 DIAGNOSIS — Z3A30 30 weeks gestation of pregnancy: Secondary | ICD-10-CM

## 2019-05-26 DIAGNOSIS — O99019 Anemia complicating pregnancy, unspecified trimester: Secondary | ICD-10-CM | POA: Insufficient documentation

## 2019-05-26 DIAGNOSIS — O0990 Supervision of high risk pregnancy, unspecified, unspecified trimester: Secondary | ICD-10-CM

## 2019-05-26 NOTE — Telephone Encounter (Signed)
Called patient to inform her of appt at [place]. Patient verbalized understanding & states she saw the appt in mychart. She plans to come by the office today around 1:30 to pick up testing supplies. Patient had no questions.

## 2019-05-26 NOTE — Progress Notes (Signed)
   [place] VIRTUAL VIDEO VISIT ENCOUNTER NOTE  Provider location: [facility] for [facility] at [place] connected with Rosana Hoes on 05/26/19 at  8:55 AM EDT by MyChart Video Encounter at home and verified that I am speaking with the correct person using two identifiers.   I discussed the limitations, risks, security and privacy concerns of performing an evaluation and management service virtually and the availability of in person appointments. I also discussed with the patient that there may be a patient responsible charge related to this service. The patient expressed understanding and agreed to proceed. Subjective:  Heather Wilkins is a 43 y.o. G5P4004 at [redacted]w[redacted]d being seen today for ongoing prenatal care.  She is currently monitored for the following issues for this high-risk pregnancy and has Supervision of high risk pregnancy, antepartum; AMA (advanced maternal age) multigravida 35+; and Gestational diabetes on their problem list.  Patient reports no complaints.  Contractions: Not present. Vag. Bleeding: None.  Movement: Present. Denies any leaking of fluid.   The following portions of the patient's history were reviewed and updated as appropriate: allergies, current medications, past family history, past medical history, past social history, past surgical history and problem list.   Objective:   Vitals:   05/26/19 0852  BP: 118/70  Pulse: 74    Fetal Status:     Movement: Present     General:  Alert, oriented and cooperative. Patient is in no acute distress.  Respiratory: Normal respiratory effort, no problems with respiration noted  Mental Status: Normal mood and affect. Normal behavior. Normal judgment and thought content.  Rest of physical exam deferred due to type of encounter  Imaging: No results found.  Assessment and Plan:  Pregnancy: [id number] at [redacted]w[redacted]d 1. Supervision of high risk pregnancy, antepartum +FM    2. Multigravida of  advanced maternal age in third trimester  3. Diet controlled gestational diabetes mellitus (GDM) in third trimester  - Referral to [facility]  4. Anemia- ante Begin iron   Preterm labor symptoms and general obstetric precautions including but not limited to vaginal bleeding, contractions, leaking of fluid and fetal movement were reviewed in detail with the patient. I discussed the assessment and treatment plan with the patient. The patient was provided an opportunity to ask questions and all were answered. The patient agreed with the plan and demonstrated an understanding of the instructions. The patient was advised to call back or seek an in-person office evaluation/go to [facility]U at [facility] for any urgent or concerning symptoms. Please refer to After Visit Summary for other counseling recommendations.   I provided 12 minutes of face-to-face time during this encounter.  No follow-ups on file.  No future appointments.  Lavonia Drafts, MD [facility], [place]

## 2019-05-26 NOTE — Progress Notes (Signed)
I connected with  Heather Wilkins on 05/26/19 at  8:55 AM EDT by telephone and verified that I am speaking with the correct person using two identifiers.   I discussed the limitations, risks, security and privacy concerns of performing an evaluation and management service by telephone and the availability of in person appointments. I also discussed with the patient that there may be a patient responsible charge related to this service. The patient expressed understanding and agreed to proceed.  [name], RN 05/26/2019  8:48 AM   Scheduled appt with Haivana Nakya 9/2 @ 315

## 2019-05-26 NOTE — Telephone Encounter (Signed)
The patient picked up the medical kit with lancets to check blood pressure at 1:21pm

## 2019-05-26 NOTE — Assessment & Plan Note (Signed)
Begin FeSO4

## 2019-06-01 ENCOUNTER — Encounter: Payer: Self-pay | Admitting: Registered"

## 2019-06-01 ENCOUNTER — Other Ambulatory Visit: Payer: Self-pay

## 2019-06-01 ENCOUNTER — Encounter: Payer: Self-pay | Attending: Internal Medicine | Admitting: Registered"

## 2019-06-01 DIAGNOSIS — O99810 Abnormal glucose complicating pregnancy: Secondary | ICD-10-CM | POA: Insufficient documentation

## 2019-06-01 NOTE — Progress Notes (Signed)
Patient was seen on 06/01/2019 for Gestational Diabetes self-management class at [facility]. The following learning objectives were met by the patient during this course:   States the definition of Gestational Diabetes  States why dietary management is important in controlling blood glucose  Describes the effects each nutrient has on blood glucose levels  Demonstrates ability to create a balanced meal plan  Demonstrates carbohydrate counting   States when to check blood glucose levels  Demonstrates proper blood glucose monitoring techniques  States the effect of stress and exercise on blood glucose levels  States the importance of limiting caffeine and abstaining from alcohol and smoking  Blood glucose monitor given: none  Patient instructed to monitor glucose levels: FBS: 60 - <95; 1 hour: <140; 2 hour: <120  Patient received handouts:  Nutrition Diabetes and Pregnancy, including carb counting list  Patient will be seen for follow-up as needed.

## 2019-06-13 ENCOUNTER — Telehealth: Payer: Self-pay | Admitting: *Deleted

## 2019-06-13 DIAGNOSIS — O24410 Gestational diabetes mellitus in pregnancy, diet controlled: Secondary | ICD-10-CM

## 2019-06-13 DIAGNOSIS — O0990 Supervision of high risk pregnancy, unspecified, unspecified trimester: Secondary | ICD-10-CM

## 2019-06-13 DIAGNOSIS — O09529 Supervision of elderly multigravida, unspecified trimester: Secondary | ICD-10-CM

## 2019-06-13 NOTE — Telephone Encounter (Addendum)
Received [employer] alert email. Patient seen for New GDM education 06/01/19 . Next ob visit 06/21/19 will route to provider.

## 2019-06-14 ENCOUNTER — Other Ambulatory Visit: Payer: Self-pay | Admitting: Obstetrics and Gynecology

## 2019-06-14 DIAGNOSIS — O24419 Gestational diabetes mellitus in pregnancy, unspecified control: Secondary | ICD-10-CM

## 2019-06-14 MED ORDER — METFORMIN HCL 500 MG PO TABS: 500.0000 mg | ORAL_TABLET | Freq: Two times a day (BID) | ORAL | 1 refills | Status: DC

## 2019-06-14 NOTE — Progress Notes (Signed)
Per [employer] review, will start metformin in the interim and likely start insulin at next visit Request to rn to schedule asap growth u/s, set up for bpp /nst this week and for future dm education visit to start insulin  Durene Romans MD Attending [facility] (Faculty Practice) 06/14/2019 Time: [phone]

## 2019-06-14 NOTE — Telephone Encounter (Signed)
I called Heather Wilkins and informed her that we had gotten alerts from [employer] that her blood sugars are high and that the doctor reviewed them. I informed her he has ordered a medication called Metformin for her to take with her breakfast and dinner everyday and to start as soon as possible. I encouraged her that she is doing good logging in her blood sugars and that it is even more important now to log in her cbg's fasting and after meals 4 times a day so the doctor can determine if the medication is enough or if she needs insulin. I also informed her the registrars will call her with appointments for an NST/BPP hopefully this week and diabetes education around her next appointment for insulin administration. I also informed her we will schedule her another [place] for growth and call her with that appointment. She states she now has letter she has 100% financial assistance and was told can get [place] here now.  Linda,RN

## 2019-06-14 NOTE — Telephone Encounter (Signed)
Please call her and tell her that she needs to start an oral medication with breakfast and dinner and that she will need to start insulin at her next visit. I'll send in metformin bidac for her in the mean time.   She also needs: -needs nst/bpp this week with diane -needs growth u/s asap ([place] okay) -needs dm education visit to start insulin   Thanks  Durene Romans MD Attending [facility] [place] (Faculty Practice) 06/14/2019 Time: [phone]

## 2019-06-16 ENCOUNTER — Other Ambulatory Visit: Payer: Self-pay

## 2019-06-16 ENCOUNTER — Ambulatory Visit (INDEPENDENT_AMBULATORY_CARE_PROVIDER_SITE_OTHER): Payer: Self-pay | Admitting: *Deleted

## 2019-06-16 ENCOUNTER — Ambulatory Visit: Payer: Self-pay

## 2019-06-16 VITALS — BP 130/64 | HR 72 | Wt 188.2 lb

## 2019-06-16 DIAGNOSIS — O09523 Supervision of elderly multigravida, third trimester: Secondary | ICD-10-CM

## 2019-06-16 DIAGNOSIS — O24419 Gestational diabetes mellitus in pregnancy, unspecified control: Secondary | ICD-10-CM

## 2019-06-16 DIAGNOSIS — O0990 Supervision of high risk pregnancy, unspecified, unspecified trimester: Secondary | ICD-10-CM

## 2019-06-16 NOTE — Addendum Note (Signed)
Addended by: Samuel Germany on: 06/16/2019 12:06 PM   Modules accepted: Orders

## 2019-06-16 NOTE — Progress Notes (Signed)

## 2019-06-16 NOTE — Telephone Encounter (Signed)
I called and scheduled Korea for first available appointment. I called Sarina and notified her. She voices understanding. Linda,RN

## 2019-06-18 LAB — URINE CULTURE, OB REFLEX

## 2019-06-18 LAB — CULTURE, OB URINE

## 2019-06-21 ENCOUNTER — Ambulatory Visit (INDEPENDENT_AMBULATORY_CARE_PROVIDER_SITE_OTHER): Payer: Self-pay | Admitting: Obstetrics and Gynecology

## 2019-06-21 ENCOUNTER — Encounter: Payer: Self-pay | Admitting: Obstetrics and Gynecology

## 2019-06-21 ENCOUNTER — Other Ambulatory Visit: Payer: Self-pay

## 2019-06-21 VITALS — BP 127/72 | HR 66 | Temp 97.9°F | Wt 184.6 lb

## 2019-06-21 DIAGNOSIS — O99013 Anemia complicating pregnancy, third trimester: Secondary | ICD-10-CM

## 2019-06-21 DIAGNOSIS — O99019 Anemia complicating pregnancy, unspecified trimester: Secondary | ICD-10-CM

## 2019-06-21 DIAGNOSIS — O0990 Supervision of high risk pregnancy, unspecified, unspecified trimester: Secondary | ICD-10-CM

## 2019-06-21 DIAGNOSIS — O09523 Supervision of elderly multigravida, third trimester: Secondary | ICD-10-CM

## 2019-06-21 DIAGNOSIS — Z3A34 34 weeks gestation of pregnancy: Secondary | ICD-10-CM

## 2019-06-21 DIAGNOSIS — O0993 Supervision of high risk pregnancy, unspecified, third trimester: Secondary | ICD-10-CM

## 2019-06-21 MED ORDER — METFORMIN HCL 500 MG PO TABS: 1000.0000 mg | ORAL_TABLET | Freq: Two times a day (BID) | ORAL | 1 refills | Status: DC

## 2019-06-21 NOTE — Progress Notes (Signed)
Prenatal Visit Note Date: 06/21/2019 Clinic: [facility]  Subjective:  Elika Dotter is a 43 y.o. G5P4004 at [redacted]w[redacted]d being seen today for ongoing prenatal care.  She is currently monitored for the following issues for this high-risk pregnancy and has Supervision of high risk pregnancy, antepartum; AMA (advanced maternal age) multigravida 75+; Oral hypoglycemic controlled White classification A2 gestational diabetes mellitus (GDM); and Anemia, antepartum, unspecified trimester on their problem list.  Patient reports no complaints.   Contractions: Not present. Vag. Bleeding: None.  Movement: Present. Denies leaking of fluid.   The following portions of the patient's history were reviewed and updated as appropriate: allergies, current medications, past family history, past medical history, past social history, past surgical history and problem list. Problem list updated.  Objective:   Vitals:   06/21/19 0928  BP: 127/72  Pulse: 66  Temp: 97.9 F (36.6 C)  Weight: 184 lb 9.6 oz (83.7 kg)    Fetal Status: Fetal Heart Rate (bpm): 137   Movement: Present     General:  Alert, oriented and cooperative. Patient is in no acute distress.  Skin: Skin is warm and dry. No rash noted.   Cardiovascular: Normal heart rate noted  Respiratory: Normal respiratory effort, no problems with respiration noted  Abdomen: Soft, gravid, appropriate for gestational age. Pain/Pressure: Absent     Pelvic:  Cervical exam deferred        Extremities: Normal range of motion.  Edema: None  Mental Status: Normal mood and affect. Normal behavior. Normal judgment and thought content.   Urinalysis:      Assessment and Plan:  Pregnancy: G5P4004 at [redacted]w[redacted]d  1. Supervision of high risk pregnancy, antepartum Routine care. Ask about bc more nv  2. GDMa2 Confirms on metformin 500 with breakfast and with dinner. Am fasting in the 100s-110s and most am fastings high in the 140s. I told her I'd  recommend insulin b/c I dont think going to 1000 bid will get the desired control. Will increase to 1000 and she already has dm visit on Thursday and likely will have to start insulin at that time Has weekly testing and growth on Thursday  3. Multigravida of advanced maternal age in third trimester No issues  Preterm labor symptoms and general obstetric precautions including but not limited to vaginal bleeding, contractions, leaking of fluid and fetal movement were reviewed in detail with the patient. Please refer to After Visit Summary for other counseling recommendations.  No follow-ups on file.   Aletha Halim, MD

## 2019-06-22 ENCOUNTER — Telehealth: Payer: Self-pay | Admitting: Family Medicine

## 2019-06-22 NOTE — Telephone Encounter (Signed)
Spoke to patient about her appointment on 9/24 @ 8:15. Patient instructed to wear a face mask for the entire appointment and no visitors are allowed with her during the visit. Patient screened for covid symptoms and denied having any

## 2019-06-23 ENCOUNTER — Ambulatory Visit (HOSPITAL_COMMUNITY): Payer: Self-pay | Admitting: *Deleted

## 2019-06-23 ENCOUNTER — Encounter: Payer: Self-pay | Admitting: *Deleted

## 2019-06-23 ENCOUNTER — Ambulatory Visit (HOSPITAL_COMMUNITY): Payer: Self-pay

## 2019-06-23 ENCOUNTER — Ambulatory Visit: Payer: Self-pay | Admitting: *Deleted

## 2019-06-23 ENCOUNTER — Other Ambulatory Visit (HOSPITAL_COMMUNITY): Payer: Self-pay | Admitting: *Deleted

## 2019-06-23 ENCOUNTER — Other Ambulatory Visit: Payer: Self-pay

## 2019-06-23 ENCOUNTER — Encounter: Payer: Self-pay | Admitting: Obstetrics and Gynecology

## 2019-06-23 ENCOUNTER — Encounter (HOSPITAL_COMMUNITY): Payer: Self-pay

## 2019-06-23 ENCOUNTER — Ambulatory Visit (HOSPITAL_COMMUNITY)
Admission: RE | Admit: 2019-06-23 | Discharge: 2019-06-23 | Disposition: A | Discharge status: Home / Self Care | Payer: Self-pay | Source: Ambulatory Visit | Attending: Obstetrics and Gynecology | Admitting: Obstetrics and Gynecology

## 2019-06-23 DIAGNOSIS — O09523 Supervision of elderly multigravida, third trimester: Secondary | ICD-10-CM

## 2019-06-23 DIAGNOSIS — O09529 Supervision of elderly multigravida, unspecified trimester: Secondary | ICD-10-CM | POA: Insufficient documentation

## 2019-06-23 DIAGNOSIS — O99019 Anemia complicating pregnancy, unspecified trimester: Secondary | ICD-10-CM

## 2019-06-23 DIAGNOSIS — O24410 Gestational diabetes mellitus in pregnancy, diet controlled: Secondary | ICD-10-CM | POA: Insufficient documentation

## 2019-06-23 DIAGNOSIS — O24419 Gestational diabetes mellitus in pregnancy, unspecified control: Secondary | ICD-10-CM | POA: Insufficient documentation

## 2019-06-23 DIAGNOSIS — O0990 Supervision of high risk pregnancy, unspecified, unspecified trimester: Secondary | ICD-10-CM

## 2019-06-23 DIAGNOSIS — Z3A34 34 weeks gestation of pregnancy: Secondary | ICD-10-CM

## 2019-06-23 DIAGNOSIS — O99013 Anemia complicating pregnancy, third trimester: Secondary | ICD-10-CM

## 2019-06-23 DIAGNOSIS — O3660X0 Maternal care for excessive fetal growth, unspecified trimester, not applicable or unspecified: Secondary | ICD-10-CM | POA: Insufficient documentation

## 2019-06-23 DIAGNOSIS — O24415 Gestational diabetes mellitus in pregnancy, controlled by oral hypoglycemic drugs: Secondary | ICD-10-CM

## 2019-06-23 NOTE — Progress Notes (Signed)
Insulin Instruction  Patient was seen on 06/23/2019 for insulin instruction.  MD orders are: I reviewed BG from [employer] with Dr. Idolina Primer and she prescribed 6 units NPH in Am and another 6 units at bedtime EDD: 08/02/2019 Patient is actively using [employer], I encouraged her to continue. The following learning objectives were met by the patient during this visit:   Insulin Action of NPH insulins  Reviewed syringe & vial including # units per syringe and vial  Hygiene and storage  Drawing up single if using vials   Single dose  Rotation of Sites  Hypoglycemia- symptoms, causes, treatment choices  Record keeping and MD follow up  Patient demonstrated understanding of insulin administration by return demonstration.  Patient received the following handouts:  Insulin Instruction Handout                                        Patient to start on insulin as Rx'd by MD  Patient will be seen for follow-up  as needed.

## 2019-06-24 ENCOUNTER — Other Ambulatory Visit: Payer: Self-pay | Admitting: *Deleted

## 2019-06-24 MED ORDER — INSULIN NPH (HUMAN) (ISOPHANE) 100 UNIT/ML ~~LOC~~ SUSP: SUBCUTANEOUS | 2 refills | Status: DC

## 2019-06-24 MED ORDER — INSULIN SYRINGES (DISPOSABLE) U-100 0.3 ML MISC: 1.0000 | Freq: Two times a day (BID) | 1 refills | Status: DC

## 2019-06-30 ENCOUNTER — Other Ambulatory Visit: Payer: Self-pay

## 2019-06-30 ENCOUNTER — Ambulatory Visit: Payer: Self-pay

## 2019-06-30 ENCOUNTER — Telehealth: Payer: Self-pay | Admitting: Obstetrics & Gynecology

## 2019-06-30 ENCOUNTER — Ambulatory Visit (INDEPENDENT_AMBULATORY_CARE_PROVIDER_SITE_OTHER): Payer: Self-pay | Admitting: *Deleted

## 2019-06-30 ENCOUNTER — Ambulatory Visit (INDEPENDENT_AMBULATORY_CARE_PROVIDER_SITE_OTHER): Payer: Self-pay | Admitting: Obstetrics & Gynecology

## 2019-06-30 VITALS — BP 115/73 | HR 68 | Temp 98.5°F | Wt 188.9 lb

## 2019-06-30 DIAGNOSIS — O0990 Supervision of high risk pregnancy, unspecified, unspecified trimester: Secondary | ICD-10-CM

## 2019-06-30 DIAGNOSIS — Z3A35 35 weeks gestation of pregnancy: Secondary | ICD-10-CM

## 2019-06-30 DIAGNOSIS — O24419 Gestational diabetes mellitus in pregnancy, unspecified control: Secondary | ICD-10-CM

## 2019-06-30 DIAGNOSIS — O0993 Supervision of high risk pregnancy, unspecified, third trimester: Secondary | ICD-10-CM

## 2019-06-30 DIAGNOSIS — O09523 Supervision of elderly multigravida, third trimester: Secondary | ICD-10-CM

## 2019-06-30 MED ORDER — INSULIN NPH (HUMAN) (ISOPHANE) 100 UNIT/ML ~~LOC~~ SUSP: SUBCUTANEOUS | 2 refills | Status: DC

## 2019-06-30 NOTE — Progress Notes (Signed)
   PRENATAL VISIT NOTE  Subjective:  Heather Wilkins is a 43 y.o. [id number] at [redacted]w[redacted]d being seen today for ongoing prenatal care.  She is currently monitored for the following issues for this high-risk pregnancy and has Supervision of high risk pregnancy, antepartum; AMA (advanced maternal age) multigravida 58+; Oral hypoglycemic controlled White classification A2 gestational diabetes mellitus (GDM); Anemia, antepartum, unspecified trimester; and LGA (large for gestational age) fetus affecting management of mother on their problem list.  Patient reports no complaints.  Contractions: Irregular. Vag. Bleeding: None.  Movement: Present. Denies leaking of fluid.   The following portions of the patient's history were reviewed and updated as appropriate: allergies, current medications, past family history, past medical history, past social history, past surgical history and problem list.   Objective:   Vitals:   06/30/19 1510  BP: 115/73  Pulse: 68  Temp: 98.5 F (36.9 C)  Weight: 188 lb 14.4 oz (85.7 kg)    Fetal Status: Fetal Heart Rate (bpm): 150   Movement: Present     General:  Alert, oriented and cooperative. Patient is in no acute distress.  Skin: Skin is warm and dry. No rash noted.   Cardiovascular: Normal heart rate noted  Respiratory: Normal respiratory effort, no problems with respiration noted  Abdomen: Soft, gravid, appropriate for gestational age.  Pain/Pressure: Absent     Pelvic: Cervical exam deferred        Extremities: Normal range of motion.  Edema: None  Mental Status: Normal mood and affect. Normal behavior. Normal judgment and thought content.   Assessment and Plan:  Pregnancy: [id number] at [redacted]w[redacted]d 1. Supervision of high risk pregnancy, antepartum In testing for DM  2. Multigravida of advanced maternal age in third trimester If has c/s, wants BTL.  Otherwise she wants an IUD.  3. GDM, class A2 Fastings still elevated with 6 units NPH.  Will increase evening dose to  8 units.    Preterm labor symptoms and general obstetric precautions including but not limited to vaginal bleeding, contractions, leaking of fluid and fetal movement were reviewed in detail with the patient. Please refer to After Visit Summary for other counseling recommendations.   Return in about 1 week (around 07/07/2019).  Future Appointments  Date Time Provider May  06/30/2019  4:15 PM Guss Bunde, MD [facility]OC-[facility]A [place]  07/07/2019  1:15 PM WOC[facility] NST [place] [facility]  07/07/2019  2:15 PM Aletha Halim, MD [facility] [place]  07/13/2019  8:15 AM [facility] NST [facility]  07/13/2019  9:15 AM Constant, Vickii Chafe, MD WOC-[facility] [place]  07/21/2019  2:45 PM [place] NURSE [place] [facility]-US  07/21/2019  2:45 PM WH-MFC [place] 2 WH-MFCUS MFC-US    Silas [place], MD

## 2019-06-30 NOTE — Telephone Encounter (Signed)
Called patient to see if she could come in the office today at 2:15 instead of 3:15. We had to make some adjustments in the office.

## 2019-07-01 ENCOUNTER — Telehealth: Payer: Self-pay | Admitting: Obstetrics & Gynecology

## 2019-07-01 NOTE — Telephone Encounter (Signed)
Attempted to call patient about her appointment change. Left a message for her to call the office.

## 2019-07-05 ENCOUNTER — Ambulatory Visit (INDEPENDENT_AMBULATORY_CARE_PROVIDER_SITE_OTHER): Payer: Self-pay | Admitting: General Practice

## 2019-07-05 ENCOUNTER — Other Ambulatory Visit: Payer: Self-pay

## 2019-07-05 ENCOUNTER — Ambulatory Visit: Payer: Self-pay

## 2019-07-05 VITALS — BP 128/71 | HR 70 | Wt 188.0 lb

## 2019-07-05 DIAGNOSIS — O24415 Gestational diabetes mellitus in pregnancy, controlled by oral hypoglycemic drugs: Secondary | ICD-10-CM

## 2019-07-05 NOTE — Progress Notes (Signed)
Pt informed that the ultrasound is considered a limited OB ultrasound and is not intended to be a complete ultrasound exam.  Patient also informed that the ultrasound is not being completed with the intent of assessing for fetal or placental anomalies or any pelvic abnormalities.  Explained that the purpose of today's ultrasound is to assess for  BPP, presentation and AFI.  Patient acknowledges the purpose of the exam and the limitations of the study.    NST/BPP done today, 10/10. Patient will return on 10/8 for OB f/u.  Koren Bound RN BSN 07/05/19

## 2019-07-07 ENCOUNTER — Other Ambulatory Visit (HOSPITAL_COMMUNITY)
Admission: RE | Admit: 2019-07-07 | Discharge: 2019-07-07 | Disposition: A | Discharge status: Home / Self Care | Payer: Medicaid Other | Source: Ambulatory Visit | Attending: Obstetrics and Gynecology | Admitting: Obstetrics and Gynecology

## 2019-07-07 ENCOUNTER — Other Ambulatory Visit: Payer: Self-pay

## 2019-07-07 ENCOUNTER — Ambulatory Visit (INDEPENDENT_AMBULATORY_CARE_PROVIDER_SITE_OTHER): Payer: Self-pay | Admitting: Obstetrics and Gynecology

## 2019-07-07 VITALS — BP 121/74 | HR 72 | Temp 98.3°F | Wt 189.1 lb

## 2019-07-07 DIAGNOSIS — O3663X0 Maternal care for excessive fetal growth, third trimester, not applicable or unspecified: Secondary | ICD-10-CM

## 2019-07-07 DIAGNOSIS — O0990 Supervision of high risk pregnancy, unspecified, unspecified trimester: Secondary | ICD-10-CM | POA: Insufficient documentation

## 2019-07-07 DIAGNOSIS — O09523 Supervision of elderly multigravida, third trimester: Secondary | ICD-10-CM

## 2019-07-07 DIAGNOSIS — O0993 Supervision of high risk pregnancy, unspecified, third trimester: Secondary | ICD-10-CM

## 2019-07-07 DIAGNOSIS — O24419 Gestational diabetes mellitus in pregnancy, unspecified control: Secondary | ICD-10-CM

## 2019-07-07 DIAGNOSIS — Z3A36 36 weeks gestation of pregnancy: Secondary | ICD-10-CM

## 2019-07-07 DIAGNOSIS — Z23 Encounter for immunization: Secondary | ICD-10-CM

## 2019-07-07 MED ORDER — INSULIN NPH (HUMAN) (ISOPHANE) 100 UNIT/ML ~~LOC~~ SUSP: SUBCUTANEOUS | 2 refills | Status: DC

## 2019-07-07 NOTE — Progress Notes (Signed)
BRx Glucose readings:       

## 2019-07-07 NOTE — Progress Notes (Signed)
Prenatal Visit Note Date: 07/07/2019 Clinic: [facility]  Subjective:  Heather Wilkins is a 43 y.o. [id number] at [redacted]w[redacted]d being seen today for ongoing prenatal care.  She is currently monitored for the following issues for this high-risk pregnancy and has Supervision of high risk pregnancy, antepartum; AMA (advanced maternal age) multigravida 35+; GDM, class A2; Anemia, antepartum, unspecified trimester; and LGA (large for gestational age) fetus affecting management of mother on their problem list.  Patient reports no complaints.   Contractions: Irritability. Vag. Bleeding: None.  Movement: Present. Denies leaking of fluid.   The following portions of the patient's history were reviewed and updated as appropriate: allergies, current medications, past family history, past medical history, past social history, past surgical history and problem list. Problem list updated.  Objective:   Vitals:   07/07/19 1437  BP: 121/74  Pulse: 72  Temp: 98.3 F (36.8 C)  Weight: 189 lb 1.6 oz (85.8 kg)    Fetal Status: Fetal Heart Rate (bpm): 141   Movement: Present  Presentation: Vertex  General:  Alert, oriented and cooperative. Patient is in no acute distress.  Skin: Skin is warm and dry. No rash noted.   Cardiovascular: Normal heart rate noted  Respiratory: Normal respiratory effort, no problems with respiration noted  Abdomen: Soft, gravid, appropriate for gestational age. Pain/Pressure: Absent     Pelvic:  Cervical exam performed Dilation: 1 Effacement (%): 50 Station: Ballotable  Extremities: Normal range of motion.  Edema: None  Mental Status: Normal mood and affect. Normal behavior. Normal judgment and thought content.   Urinalysis:      Assessment and Plan:  Pregnancy: G5P4004 at [redacted]w[redacted]d  1. Supervision of high risk pregnancy, antepartum Routine care. - GC/Chlamydia probe amp ([place])not at [facility] - Culture, beta strep (group b only) - Flu Vaccine QUAD 36+ mos  IM  2. GDM, class A2 On nph 6/8. Am fastings still in the 100s. Increase to 11 qhs and keep 6 in the morning bpp 10/10 on 10/6. Continue with qwk testing  3. Multigravida of advanced maternal age in third trimester  4. Excessive fetal growth affecting management of pregnancy in third trimester, single or unspecified fetus F/u growth later this month. Largest prior 3600gm.   Preterm labor symptoms and general obstetric precautions including but not limited to vaginal bleeding, contractions, leaking of fluid and fetal movement were reviewed in detail with the patient. Please refer to After Visit Summary for other counseling recommendations.  Return in about 1 week (around 07/14/2019) for already scheduled.   Aletha Halim, MD

## 2019-07-11 LAB — CULTURE, BETA STREP (GROUP B ONLY): Strep Gp B Culture: NEGATIVE

## 2019-07-13 ENCOUNTER — Ambulatory Visit (INDEPENDENT_AMBULATORY_CARE_PROVIDER_SITE_OTHER): Payer: Self-pay | Admitting: Obstetrics and Gynecology

## 2019-07-13 ENCOUNTER — Ambulatory Visit: Payer: Self-pay

## 2019-07-13 ENCOUNTER — Encounter: Payer: Self-pay | Admitting: Obstetrics and Gynecology

## 2019-07-13 ENCOUNTER — Ambulatory Visit (INDEPENDENT_AMBULATORY_CARE_PROVIDER_SITE_OTHER): Payer: Self-pay | Admitting: General Practice

## 2019-07-13 ENCOUNTER — Other Ambulatory Visit: Payer: Self-pay

## 2019-07-13 VITALS — BP 127/70 | HR 65 | Wt 189.0 lb

## 2019-07-13 DIAGNOSIS — O24419 Gestational diabetes mellitus in pregnancy, unspecified control: Secondary | ICD-10-CM

## 2019-07-13 DIAGNOSIS — O0990 Supervision of high risk pregnancy, unspecified, unspecified trimester: Secondary | ICD-10-CM

## 2019-07-13 DIAGNOSIS — O09523 Supervision of elderly multigravida, third trimester: Secondary | ICD-10-CM

## 2019-07-13 DIAGNOSIS — Z3A37 37 weeks gestation of pregnancy: Secondary | ICD-10-CM

## 2019-07-13 DIAGNOSIS — O3663X0 Maternal care for excessive fetal growth, third trimester, not applicable or unspecified: Secondary | ICD-10-CM

## 2019-07-13 DIAGNOSIS — O0993 Supervision of high risk pregnancy, unspecified, third trimester: Secondary | ICD-10-CM

## 2019-07-13 NOTE — Progress Notes (Signed)
   PRENATAL VISIT NOTE  Subjective:  Heather Wilkins is a 43 y.o. [id number] at [redacted]w[redacted]d being seen today for ongoing prenatal care.  She is currently monitored for the following issues for this high-risk pregnancy and has Supervision of high risk pregnancy, antepartum; AMA (advanced maternal age) multigravida 35+; GDM, class A2; Anemia, antepartum, unspecified trimester; and LGA (large for gestational age) fetus affecting management of mother on their problem list.  Patient reports no complaints.  Contractions: Irritability. Vag. Bleeding: None.  Movement: Present. Denies leaking of fluid.   The following portions of the patient's history were reviewed and updated as appropriate: allergies, current medications, past family history, past medical history, past social history, past surgical history and problem list.   Objective:   Vitals:   07/13/19 0910  BP: 127/70  Pulse: 65  Weight: 189 lb (85.7 kg)    Fetal Status: Fetal Heart Rate (bpm): NST   Movement: Present     General:  Alert, oriented and cooperative. Patient is in no acute distress.  Skin: Skin is warm and dry. No rash noted.   Cardiovascular: Normal heart rate noted  Respiratory: Normal respiratory effort, no problems with respiration noted  Abdomen: Soft, gravid, appropriate for gestational age.  Pain/Pressure: Absent     Pelvic: Cervical exam deferred        Extremities: Normal range of motion.  Edema: None  Mental Status: Normal mood and affect. Normal behavior. Normal judgment and thought content.   Assessment and Plan:  Pregnancy: [id number] at [redacted]w[redacted]d 1. Supervision of high risk pregnancy, antepartum Patient is doing well  2. GDM, class A2 CBGs reviewed and fasting persistently at 100 and post dinner values elevated 136-150. Will increase NPH 8/13 (previously 6/11) Plan for IOl at 39 weeks  3. Multigravida of advanced maternal age in third trimester   4. Excessive fetal growth affecting management of pregnancy in third  trimester, single or unspecified fetus Follow up growth later this month  Term labor symptoms and general obstetric precautions including but not limited to vaginal bleeding, contractions, leaking of fluid and fetal movement were reviewed in detail with the patient. Please refer to After Visit Summary for other counseling recommendations.   No follow-ups on file.  Future Appointments  Date Time Provider [place]  07/13/2019  9:35 AM [facility] IMAGING [place] [facility]  07/21/2019  1:15 PM WOC[facility] NST [facility] W[facility]  07/21/2019  2:45 PM [place] NURSE [place] [facility]-US  07/21/2019  2:45 PM [place] 2 WH-MFCUS MFC-US  07/21/2019  4:15 PM Wood[place] Mode, MD [place]  07/26/2019  6:30 AM MC-LD [place] None    Mora Bellman, MD

## 2019-07-13 NOTE — Patient Instructions (Signed)
lo

## 2019-07-13 NOTE — Progress Notes (Signed)
Pt informed that the ultrasound is considered a limited OB ultrasound and is not intended to be a complete ultrasound exam.  Patient also informed that the ultrasound is not being completed with the intent of assessing for fetal or placental anomalies or any pelvic abnormalities.  Explained that the purpose of today's ultrasound is to assess for  BPP, presentation and AFI.  Patient acknowledges the purpose of the exam and the limitations of the study.    NST/BPP today. Patient to see Dr Elly Modena for OB visit after.  Koren Bound RN BSN 07/13/19

## 2019-07-13 NOTE — Progress Notes (Signed)
     IOL scheduled 10/27 in AM

## 2019-07-18 LAB — GC/CHLAMYDIA PROBE AMP (~~LOC~~) NOT AT ARMC
Chlamydia: NEGATIVE
Comment: NEGATIVE
Comment: NORMAL
Neisseria Gonorrhea: NEGATIVE

## 2019-07-20 ENCOUNTER — Encounter (HOSPITAL_COMMUNITY): Payer: Self-pay | Admitting: *Deleted

## 2019-07-20 ENCOUNTER — Other Ambulatory Visit: Payer: Self-pay | Admitting: Advanced Practice Midwife

## 2019-07-20 ENCOUNTER — Telehealth (HOSPITAL_COMMUNITY): Payer: Self-pay | Admitting: *Deleted

## 2019-07-20 NOTE — Telephone Encounter (Signed)
Preadmission screen  

## 2019-07-21 ENCOUNTER — Other Ambulatory Visit: Payer: Self-pay

## 2019-07-21 ENCOUNTER — Ambulatory Visit (INDEPENDENT_AMBULATORY_CARE_PROVIDER_SITE_OTHER): Payer: Self-pay | Admitting: Obstetrics & Gynecology

## 2019-07-21 ENCOUNTER — Ambulatory Visit (HOSPITAL_COMMUNITY): Payer: Self-pay | Admitting: *Deleted

## 2019-07-21 ENCOUNTER — Encounter (HOSPITAL_COMMUNITY): Payer: Self-pay

## 2019-07-21 ENCOUNTER — Ambulatory Visit (HOSPITAL_COMMUNITY)
Admission: RE | Admit: 2019-07-21 | Discharge: 2019-07-21 | Disposition: A | Discharge status: Home / Self Care | Payer: Self-pay | Source: Ambulatory Visit | Attending: Maternal & Fetal Medicine | Admitting: Maternal & Fetal Medicine

## 2019-07-21 ENCOUNTER — Ambulatory Visit: Payer: Self-pay | Admitting: General Practice

## 2019-07-21 ENCOUNTER — Other Ambulatory Visit (HOSPITAL_COMMUNITY): Payer: Self-pay | Admitting: Maternal & Fetal Medicine

## 2019-07-21 VITALS — BP 123/71 | HR 71 | Wt 192.0 lb

## 2019-07-21 DIAGNOSIS — O09523 Supervision of elderly multigravida, third trimester: Secondary | ICD-10-CM

## 2019-07-21 DIAGNOSIS — O24415 Gestational diabetes mellitus in pregnancy, controlled by oral hypoglycemic drugs: Secondary | ICD-10-CM | POA: Insufficient documentation

## 2019-07-21 DIAGNOSIS — O99019 Anemia complicating pregnancy, unspecified trimester: Secondary | ICD-10-CM | POA: Insufficient documentation

## 2019-07-21 DIAGNOSIS — O24419 Gestational diabetes mellitus in pregnancy, unspecified control: Secondary | ICD-10-CM

## 2019-07-21 DIAGNOSIS — Z362 Encounter for other antenatal screening follow-up: Secondary | ICD-10-CM

## 2019-07-21 DIAGNOSIS — Z3A38 38 weeks gestation of pregnancy: Secondary | ICD-10-CM

## 2019-07-21 DIAGNOSIS — O0990 Supervision of high risk pregnancy, unspecified, unspecified trimester: Secondary | ICD-10-CM

## 2019-07-21 DIAGNOSIS — O24414 Gestational diabetes mellitus in pregnancy, insulin controlled: Secondary | ICD-10-CM

## 2019-07-21 DIAGNOSIS — O0993 Supervision of high risk pregnancy, unspecified, third trimester: Secondary | ICD-10-CM

## 2019-07-21 MED ORDER — INSULIN NPH (HUMAN) (ISOPHANE) 100 UNIT/ML ~~LOC~~ SUSP: SUBCUTANEOUS | 2 refills | Status: DC

## 2019-07-21 NOTE — Patient Instructions (Signed)

## 2019-07-21 NOTE — Progress Notes (Signed)
   PRENATAL VISIT NOTE  Subjective:  Heather Wilkins is a 43 y.o. G5P4004 at [redacted]w[redacted]d being seen today for ongoing prenatal care.  She is currently monitored for the following issues for this high-risk pregnancy and has Supervision of high risk pregnancy, antepartum; AMA (advanced maternal age) multigravida 35+; GDM, class A2; Anemia, antepartum, unspecified trimester; and LGA (large for gestational age) fetus affecting management of mother on their problem list.  Patient reports no complaints.  Contractions: Irritability. Vag. Bleeding: None.  Movement: Present. Denies leaking of fluid.   The following portions of the patient's history were reviewed and updated as appropriate: allergies, current medications, past family history, past medical history, past social history, past surgical history and problem list.   Objective:   Vitals:   07/21/19 1341  BP: 123/71  Pulse: 71  Weight: 192 lb (87.1 kg)    Fetal Status: Fetal Heart Rate (bpm): NST   Movement: Present     General:  Alert, oriented and cooperative. Patient is in no acute distress.  Skin: Skin is warm and dry. No rash noted.   Cardiovascular: Normal heart rate noted  Respiratory: Normal respiratory effort, no problems with respiration noted  Abdomen: Soft, gravid, appropriate for gestational age.  Pain/Pressure: Present     Pelvic: Cervical exam deferred        Extremities: Normal range of motion.  Edema: None  Mental Status: Normal mood and affect. Normal behavior. Normal judgment and thought content.   Assessment and Plan:  Pregnancy: [id number] at [redacted]w[redacted]d 1. GDM, class A2 FBS 100-110, increase insulin dose - insulin NPH Human (NOVOLIN N) 100 UNIT/ML injection; Inject 10 units into the skin daily with breakfast and 15 units daily at bedtime  Dispense: 10 mL; Refill: 2  2. Supervision of high risk pregnancy, antepartum  - insulin NPH Human (NOVOLIN N) 100 UNIT/ML injection; Inject 10 units into the skin daily with breakfast and  15 units daily at bedtime  Dispense: 10 mL; Refill: 2  3. Multigravida of advanced maternal age in third trimester  - insulin NPH Human (NOVOLIN N) 100 UNIT/ML injection; Inject 10 units into the skin daily with breakfast and 15 units daily at bedtime  Dispense: 10 mL; Refill: 2  Term labor symptoms and general obstetric precautions including but not limited to vaginal bleeding, contractions, leaking of fluid and fetal movement were reviewed in detail with the patient. Please refer to After Visit Summary for other counseling recommendations.   Return in about 6 weeks (around 09/01/2019) for postpartum.  Future Appointments  Date Time Provider [place]  07/21/2019  2:45 PM [facility] NURSE [facility] MFC-US  07/21/2019  2:45 PM [place] 2 WH-MFCUS MFC-US  07/21/2019  4:15 PM [place] Mode, MD WOC-WOCA WOC  07/24/2019  8:00 AM MC-MAU 1 MC-INDC None  07/26/2019  6:30 AM MC-LD [place] None  iol 39 weeks  Emeterio Reeve, MD

## 2019-07-24 ENCOUNTER — Other Ambulatory Visit: Payer: Self-pay

## 2019-07-24 ENCOUNTER — Other Ambulatory Visit (HOSPITAL_COMMUNITY)
Admission: RE | Admit: 2019-07-24 | Discharge: 2019-07-24 | Disposition: A | Discharge status: Home / Self Care | Payer: Self-pay | Source: Ambulatory Visit | Attending: Obstetrics and Gynecology | Admitting: Obstetrics and Gynecology

## 2019-07-24 DIAGNOSIS — Z20828 Contact with and (suspected) exposure to other viral communicable diseases: Secondary | ICD-10-CM | POA: Insufficient documentation

## 2019-07-24 LAB — SARS CORONAVIRUS 2 (TAT 6-24 HRS): SARS Coronavirus 2: NEGATIVE

## 2019-07-24 NOTE — MAU Note (Signed)
Pt here for PAT covid swab, denies symptoms. Swab collected. 

## 2019-07-26 ENCOUNTER — Inpatient Hospital Stay (HOSPITAL_COMMUNITY): Payer: Medicaid Other | Admitting: Anesthesiology

## 2019-07-26 ENCOUNTER — Other Ambulatory Visit: Payer: Self-pay

## 2019-07-26 ENCOUNTER — Inpatient Hospital Stay (HOSPITAL_COMMUNITY): Payer: Medicaid Other

## 2019-07-26 ENCOUNTER — Inpatient Hospital Stay (HOSPITAL_COMMUNITY)
Admission: AD | Admit: 2019-07-26 | Discharge: 2019-07-28 | DRG: 807 | Disposition: A | Discharge status: Home / Self Care | Payer: Medicaid Other | Attending: Family Medicine | Admitting: Family Medicine

## 2019-07-26 ENCOUNTER — Encounter (HOSPITAL_COMMUNITY): Payer: Self-pay | Admitting: *Deleted

## 2019-07-26 DIAGNOSIS — O9902 Anemia complicating childbirth: Secondary | ICD-10-CM | POA: Diagnosis present

## 2019-07-26 DIAGNOSIS — Z7982 Long term (current) use of aspirin: Secondary | ICD-10-CM | POA: Diagnosis not present

## 2019-07-26 DIAGNOSIS — O3663X0 Maternal care for excessive fetal growth, third trimester, not applicable or unspecified: Secondary | ICD-10-CM | POA: Diagnosis present

## 2019-07-26 DIAGNOSIS — D649 Anemia, unspecified: Secondary | ICD-10-CM | POA: Diagnosis present

## 2019-07-26 DIAGNOSIS — O24419 Gestational diabetes mellitus in pregnancy, unspecified control: Secondary | ICD-10-CM

## 2019-07-26 DIAGNOSIS — O0990 Supervision of high risk pregnancy, unspecified, unspecified trimester: Secondary | ICD-10-CM

## 2019-07-26 DIAGNOSIS — Z3490 Encounter for supervision of normal pregnancy, unspecified, unspecified trimester: Secondary | ICD-10-CM | POA: Diagnosis present

## 2019-07-26 DIAGNOSIS — O134 Gestational [pregnancy-induced] hypertension without significant proteinuria, complicating childbirth: Secondary | ICD-10-CM | POA: Diagnosis present

## 2019-07-26 DIAGNOSIS — O24424 Gestational diabetes mellitus in childbirth, insulin controlled: Secondary | ICD-10-CM | POA: Diagnosis present

## 2019-07-26 DIAGNOSIS — Z3A39 39 weeks gestation of pregnancy: Secondary | ICD-10-CM | POA: Diagnosis not present

## 2019-07-26 DIAGNOSIS — O99019 Anemia complicating pregnancy, unspecified trimester: Secondary | ICD-10-CM

## 2019-07-26 DIAGNOSIS — O09523 Supervision of elderly multigravida, third trimester: Secondary | ICD-10-CM

## 2019-07-26 LAB — TYPE AND SCREEN
ABO/RH(D): B POS
Antibody Screen: NEGATIVE

## 2019-07-26 LAB — CBC
HCT: 34.1 % — ABNORMAL LOW (ref 36.0–46.0)
Hemoglobin: 11.4 g/dL — ABNORMAL LOW (ref 12.0–15.0)
MCH: 31.5 pg (ref 26.0–34.0)
MCHC: 33.4 g/dL (ref 30.0–36.0)
MCV: 94.2 fL (ref 80.0–100.0)
Platelets: 186 10*3/uL (ref 150–400)
RBC: 3.62 MIL/uL — ABNORMAL LOW (ref 3.87–5.11)
RDW: 14 % (ref 11.5–15.5)
WBC: 8.8 10*3/uL (ref 4.0–10.5)
nRBC: 0.3 % — ABNORMAL HIGH (ref 0.0–0.2)

## 2019-07-26 LAB — COMPREHENSIVE METABOLIC PANEL
ALT: 21 U/L (ref 0–44)
AST: 25 U/L (ref 15–41)
Albumin: 2.7 g/dL — ABNORMAL LOW (ref 3.5–5.0)
Alkaline Phosphatase: 102 U/L (ref 38–126)
Anion gap: 9 (ref 5–15)
BUN: 10 mg/dL (ref 6–20)
CO2: 18 mmol/L — ABNORMAL LOW (ref 22–32)
Calcium: 8.7 mg/dL — ABNORMAL LOW (ref 8.9–10.3)
Chloride: 110 mmol/L (ref 98–111)
Creatinine, Ser: 0.87 mg/dL (ref 0.44–1.00)
GFR calc Af Amer: >60 mL/min (ref >60)
GFR calc non Af Amer: >60 mL/min (ref >60)
Glucose, Bld: 88 mg/dL (ref 70–99)
Potassium: 3.8 mmol/L (ref 3.5–5.1)
Sodium: 137 mmol/L (ref 135–145)
Total Bilirubin: 0.6 mg/dL (ref 0.3–1.2)
Total Protein: 6.4 g/dL — ABNORMAL LOW (ref 6.5–8.1)

## 2019-07-26 LAB — PROTEIN / CREATININE RATIO, URINE
Creatinine, Urine: 55.38 mg/dL
Creatinine, Urine: 54.61 mg/dL
Protein Creatinine Ratio: 1.14 mg/mg{Cre} — ABNORMAL HIGH (ref 0.00–0.15)
Total Protein, Urine: 63 mg/dL
Total Protein, Urine: <6 mg/dL

## 2019-07-26 LAB — GLUCOSE, CAPILLARY
Glucose-Capillary: 91 mg/dL (ref 70–99)
Glucose-Capillary: 100 mg/dL — ABNORMAL HIGH (ref 70–99)
Glucose-Capillary: 80 mg/dL (ref 70–99)
Glucose-Capillary: 75 mg/dL (ref 70–99)

## 2019-07-26 LAB — ABO/RH: ABO/RH(D): B POS

## 2019-07-26 LAB — RPR: RPR Ser Ql: NONREACTIVE

## 2019-07-26 MED ORDER — LACTATED RINGERS IV SOLN: 500.0000 mL | Freq: Once | INTRAVENOUS | Status: DC

## 2019-07-26 MED ORDER — OXYTOCIN BOLUS FROM INFUSION: 500.0000 mL | Freq: Once | INTRAVENOUS | Status: DC

## 2019-07-26 MED ORDER — ONDANSETRON HCL 4 MG/2ML IJ SOLN: 4.0000 mg | Freq: Four times a day (QID) | INTRAMUSCULAR | Status: DC | PRN

## 2019-07-26 MED ORDER — LIDOCAINE HCL (PF) 1 % IJ SOLN: 30.0000 mL | INTRAMUSCULAR | Status: DC | PRN

## 2019-07-26 MED ORDER — OXYTOCIN 40 UNITS IN NORMAL SALINE INFUSION - SIMPLE MED: 1.0000 m[IU]/min | INTRAVENOUS | Status: DC

## 2019-07-26 MED ORDER — OXYCODONE-ACETAMINOPHEN 5-325 MG PO TABS: 2.0000 | ORAL_TABLET | ORAL | Status: DC | PRN

## 2019-07-26 MED ORDER — TERBUTALINE SULFATE 1 MG/ML IJ SOLN: 0.2500 mg | Freq: Once | INTRAMUSCULAR | Status: DC | PRN

## 2019-07-26 MED ORDER — OXYCODONE-ACETAMINOPHEN 5-325 MG PO TABS: 1.0000 | ORAL_TABLET | ORAL | Status: DC | PRN

## 2019-07-26 MED ORDER — EPHEDRINE 5 MG/ML INJ: 10.0000 mg | INTRAVENOUS | Status: DC | PRN

## 2019-07-26 MED ORDER — INSULIN NPH (HUMAN) (ISOPHANE) 100 UNIT/ML ~~LOC~~ SUSP
10.0000 [IU] | Freq: Once | SUBCUTANEOUS | Status: AC
  Administered 2019-07-26: 10 [IU] via SUBCUTANEOUS
  Filled 2019-07-26: qty 10

## 2019-07-26 MED ORDER — SODIUM CHLORIDE (PF) 0.9 % IJ SOLN
INTRAMUSCULAR | Status: DC | PRN
  Administered 2019-07-26: 12 mL/h via EPIDURAL

## 2019-07-26 MED ORDER — FENTANYL-BUPIVACAINE-NACL 0.5-0.125-0.9 MG/250ML-% EP SOLN
12.0000 mL/h | EPIDURAL | Status: DC | PRN
  Filled 2019-07-26: qty 250

## 2019-07-26 MED ORDER — LACTATED RINGERS IV SOLN
INTRAVENOUS | Status: DC
  Administered 2019-07-26 (×2): via INTRAVENOUS

## 2019-07-26 MED ORDER — ACETAMINOPHEN 325 MG PO TABS
650.0000 mg | ORAL_TABLET | ORAL | Status: DC | PRN
  Administered 2019-07-26: 20:00:00 650 mg via ORAL
  Filled 2019-07-26: qty 2

## 2019-07-26 MED ORDER — SOD CITRATE-CITRIC ACID 500-334 MG/5ML PO SOLN: 30.0000 mL | ORAL | Status: DC | PRN

## 2019-07-26 MED ORDER — LIDOCAINE HCL (PF) 1 % IJ SOLN
INTRAMUSCULAR | Status: DC | PRN
  Administered 2019-07-26: 6 mL via EPIDURAL

## 2019-07-26 MED ORDER — OXYTOCIN 40 UNITS IN NORMAL SALINE INFUSION - SIMPLE MED
1.0000 m[IU]/min | INTRAVENOUS | Status: DC
  Administered 2019-07-26: 1 m[IU]/min via INTRAVENOUS

## 2019-07-26 MED ORDER — LACTATED RINGERS IV SOLN: 500.0000 mL | INTRAVENOUS | Status: DC | PRN

## 2019-07-26 MED ORDER — PHENYLEPHRINE 40 MCG/ML (10ML) SYRINGE FOR IV PUSH (FOR BLOOD PRESSURE SUPPORT): 80.0000 ug | PREFILLED_SYRINGE | INTRAVENOUS | Status: DC | PRN

## 2019-07-26 MED ORDER — OXYTOCIN 40 UNITS IN NORMAL SALINE INFUSION - SIMPLE MED
2.5000 [IU]/h | INTRAVENOUS | Status: DC
  Filled 2019-07-26: qty 1000

## 2019-07-26 MED ORDER — DIPHENHYDRAMINE HCL 50 MG/ML IJ SOLN: 12.5000 mg | INTRAMUSCULAR | Status: DC | PRN

## 2019-07-26 MED ORDER — PHENYLEPHRINE 40 MCG/ML (10ML) SYRINGE FOR IV PUSH (FOR BLOOD PRESSURE SUPPORT)
80.0000 ug | PREFILLED_SYRINGE | INTRAVENOUS | Status: DC | PRN
  Filled 2019-07-26: qty 10

## 2019-07-26 MED ORDER — MISOPROSTOL 25 MCG QUARTER TABLET
25.0000 ug | ORAL_TABLET | ORAL | Status: DC | PRN
  Administered 2019-07-26: 25 ug via VAGINAL
  Filled 2019-07-26: qty 1

## 2019-07-26 NOTE — Progress Notes (Signed)
LABOR PROGRESS NOTE  Heather Wilkins is a 43 y.o. [id number] at [redacted]w[redacted]d  admitted for IOL for [place].  Subjective: Feeling contractions slightly more Denies headache, vision changes, SOB  Objective: BP 134/75   Pulse 60   Temp 98.7 F (37.1 C) (Oral)   Resp 14   Ht 5\' 4"  (1.626 m)   Wt 87.1 kg   LMP 10/26/2018 (Exact Date)   BMI 32.96 kg/m  or  Vitals:   07/26/19 1424 07/26/19 1458 07/26/19 1601 07/26/19 1635  BP: 133/72 129/78 125/85 134/75  Pulse: 63 63 60 60  Resp:      Temp:      TempSrc:      Weight:      Height:         Dilation: 4.5 Effacement (%): 50 Station: -3 Presentation: Vertex Exam by:: Ignacia Felling, RN FHT: baseline rate 145, moderate varibility, +acel, -decel Toco: q2-3 min  Labs: Lab Results  Component Value Date   WBC 8.8 07/26/2019   HGB 11.4 (L) 07/26/2019   HCT 34.1 (L) 07/26/2019   MCV 94.2 07/26/2019   PLT 186 07/26/2019    Patient Active Problem List   Diagnosis Date Noted  . Encounter for induction of labor 07/26/2019  . LGA (large for gestational age) fetus affecting management of mother 06/23/2019  . Anemia, antepartum, unspecified trimester 05/26/2019  . GDM, class A2 04/29/2019  . Supervision of high risk pregnancy, antepartum 02/08/2019  . AMA (advanced maternal age) multigravida 35+ 02/08/2019    Assessment / Plan: 43 y.o. G5P4004 at [redacted]w[redacted]d here for [place].  Labor: s/p miso x1 and FB (out at ~1430). Low dose pit starting at 1247, uptitrated to 2x2 since FB out. Recheck cervical exam @ 1700, AROM at that time if feasible.  Fetal Wellbeing:  Cat I Pain Control:  IV pain meds PRN, epidural upon maternal request GBS: negative Anticipated MOD:  SVD  A2GDM: sugars well controlled since arrival, on q4h checks with NISS. q2h checks active labor.    gHTN vs Preeclampsia w/o SF: now with two mild range BP's, labs notable for P:C 1.14, Cr 0.87 (no baseline available but elevated for pregnancy) and normal platelets/LFTs. Asymptomatic.  Per discussion w RN urine sample had significant amount of blood, will send straight cath sample to clarify diagnosis. Discussed with patient, all questions answered.   Augustin Coupe, MD/MPH OB Fellow  07/26/2019, 4:41 PM

## 2019-07-26 NOTE — Anesthesia Preprocedure Evaluation (Signed)
Anesthesia Evaluation  Patient identified by MRN, date of birth, ID band Patient awake    Reviewed: Allergy & Precautions, H&P , NPO status , Patient's Chart, lab work & pertinent test results, reviewed documented beta blocker date and time   Airway Mallampati: II  TM Distance: >3 FB Neck ROM: full    Dental no notable dental hx.    Pulmonary neg pulmonary ROS,    Pulmonary exam normal breath sounds clear to auscultation       Cardiovascular negative cardio ROS Normal cardiovascular exam Rhythm:regular Rate:Normal     Neuro/Psych negative neurological ROS  negative psych ROS   GI/Hepatic negative GI ROS, Neg liver ROS,   Endo/Other  negative endocrine ROSdiabetes  Renal/GU negative Renal ROS  negative genitourinary   Musculoskeletal   Abdominal   Peds  Hematology negative hematology ROS (+)   Anesthesia Other Findings   Reproductive/Obstetrics (+) Pregnancy                             Anesthesia Physical Anesthesia Plan  ASA: III  Anesthesia Plan: Epidural   Post-op Pain Management:    Induction:   PONV Risk Score and Plan:   Airway Management Planned:   Additional Equipment:   Intra-op Plan:   Post-operative Plan:   Informed Consent: I have reviewed the patients History and Physical, chart, labs and discussed the procedure including the risks, benefits and alternatives for the proposed anesthesia with the patient or authorized representative who has indicated his/her understanding and acceptance.     Dental Advisory Given  Plan Discussed with: Anesthesiologist  Anesthesia Plan Comments: (Labs checked- platelets confirmed with RN in room. Fetal heart tracing, per RN, reported to be stable enough for sitting procedure. Discussed epidural, and patient consents to the procedure:  included risk of possible headache,backache, failed block, allergic reaction, and nerve  injury. This patient was asked if she had any questions or concerns before the procedure started.)        Anesthesia Quick Evaluation  

## 2019-07-26 NOTE — Anesthesia Procedure Notes (Signed)
Epidural Patient location during procedure: OB Start time: 07/26/2019 7:30 PM End time: 07/26/2019 9:03 PM  Staffing Anesthesiologist: Janeece Riggers, MD  Preanesthetic Checklist Completed: patient identified, site marked, surgical consent, pre-op evaluation, timeout performed, IV checked, risks and benefits discussed and monitors and equipment checked  Epidural Patient position: sitting Prep: site prepped and draped and DuraPrep Patient monitoring: continuous pulse ox and blood pressure Approach: midline Location: L3-L4 Injection technique: LOR air  Needle:  Needle type: Tuohy  Needle gauge: 17 G Needle length: 9 cm and 9 Needle insertion depth: 7 cm Catheter type: closed end flexible Catheter size: 19 Gauge Catheter at skin depth: 12 cm Test dose: negative  Assessment Events: blood not aspirated, injection not painful, no injection resistance, negative IV test and no paresthesia

## 2019-07-26 NOTE — Progress Notes (Signed)
LABOR PROGRESS NOTE  Heather Wilkins is a 43 y.o. [id number] at [redacted]w[redacted]d  admitted for IOL for [name].  Subjective: Starting to feel contractions slightly but overall comfortable  Objective: BP 135/79   Pulse 62   Temp 98.7 F (37.1 C) (Oral)   Resp 16   Ht 5\' 4"  (1.626 m)   Wt 87.1 kg   LMP 10/26/2018 (Exact Date)   BMI 32.96 kg/m  or  Vitals:   07/26/19 0724 07/26/19 0818 07/26/19 0913 07/26/19 1118  BP:  129/78 125/75 135/79  Pulse:  67 66 62  Resp:  16 14 16   Temp:    98.7 F (37.1 C)  TempSrc:    Oral  Weight: 87.1 kg     Height: 5\' 4"  (1.626 m)        Dilation: Fingertip Effacement (%): Thick Station: Ballotable Presentation: Vertex Exam by:: [name] FHT: baseline rate 140, moderate varibility, +acel, -decel Toco: q2-3 min  Labs: Lab Results  Component Value Date   WBC 8.8 07/26/2019   HGB 11.4 (L) 07/26/2019   HCT 34.1 (L) 07/26/2019   MCV 94.2 07/26/2019   PLT 186 07/26/2019    Patient Active Problem List   Diagnosis Date Noted  . Encounter for induction of labor 07/26/2019  . LGA (large for gestational age) fetus affecting management of mother 06/23/2019  . Anemia, antepartum, unspecified trimester 05/26/2019  . GDM, class A2 04/29/2019  . Supervision of high risk pregnancy, antepartum 02/08/2019  . AMA (advanced maternal age) multigravida 35+ 02/08/2019    Assessment / Plan: 43 y.o. G5P4004 at [redacted]w[redacted]d here for [place].  Labor: s/p miso x1. FB placed at 1220 w 60cc, currently contracting too much for miso, will monitor for half hour and if able to give miso will do so, otherwise start low dose pit.  Fetal Wellbeing:  Cat I Pain Control:  IV pain meds PRN, epidural upon maternal request GBS: negative Anticipated MOD:  SVD  A2GDM: given NPH 10 this AM (home regimen) and light laboring diet, accuchecks q4h and 2hr post prandial while in latent labor with NISS, liquid diet/q4h-->q2h checks once more active. Sugars well controlled so far.    Elevated BP: single elevated BP on arrival of 125/90, no prior elevated BP's and subsequently normotensive. Will send labs but low suspicion.  Augustin Coupe, MD/MPH OB Fellow  07/26/2019, 12:25 PM

## 2019-07-26 NOTE — Progress Notes (Signed)
Heather Wilkins is a 43 y.o. G5P4004 at [redacted]w[redacted]d admitted for IOL 2/2 GMDA1  Subjective: Comfortable with epidural. Feeling baby move  Objective: BP 124/72   Pulse 67   Temp 98.6 F (37 C) (Oral)   Resp 16   Ht 5\' 4"  (1.626 m)   Wt 87.1 kg   LMP 10/26/2018 (Exact Date)   SpO2 99%   BMI 32.96 kg/m  No intake/output data recorded.  FHT:  FHR: 145 bpm, variability: moderate,  accelerations:  Present,  decelerations:  Present occasional variables UC:   regular, every q2-3 minutes  SVE:   Dilation: 8 Effacement (%): 100 Station: -1 Exam by:: Dr. Benancio Deeds  Pitocin @ 10 mu/min  Labs: Lab Results  Component Value Date   WBC 8.8 07/26/2019   HGB 11.4 (L) 07/26/2019   HCT 34.1 (L) 07/26/2019   MCV 94.2 07/26/2019   PLT 186 07/26/2019    Assessment / Plan: 43 y.o. [id number] at [redacted]w[redacted]d here for [place].  Labor: s/p miso x1, FB, and AROM. Low dose pit starting at 1247, now at 10.  Fetal Wellbeing:  Cat II for variables, reassuring for moderate variability. Pain Control:  Epidural GBS: negative Anticipated MOD:  vaginal delivery, CS as appropriate.  A2GDM: sugars well controlled since arrival, on q4h checks with NISS. q2h checks active labor.    gHTN: no severe range BP's, Pre-E labs normal. Original  P:Cr is 0.87 (no baseline to compare), repeat (done due to possible contamination) was normal. CTM.  Merilyn Baba DO OB Fellow, Faculty Practice 07/26/2019, 10:18 PM

## 2019-07-26 NOTE — Progress Notes (Signed)
LABOR PROGRESS NOTE  Heather Wilkins is a 43 y.o. Y2Q8250 at [redacted]w[redacted]d  admitted for IOL for [place].  Subjective: Feeling more uncomfortable  Objective: BP (!) 143/82   Pulse 65   Temp 98.7 F (37.1 C) (Oral)   Resp 14   Ht 5\' 4"  (1.626 m)   Wt 87.1 kg   LMP 10/26/2018 (Exact Date)   BMI 32.96 kg/m  or  Vitals:   07/26/19 1601 07/26/19 1635 07/26/19 1803 07/26/19 1829  BP: 125/85 134/75 135/79 (!) 143/82  Pulse: 60 60 62 65  Resp:      Temp:      TempSrc:      Weight:      Height:         Dilation: 4 Effacement (%): Thick Station: -2 Presentation: Vertex Exam by:: [employer] FHT: baseline rate 145, moderate varibility, +acel, +variable decel Toco: q1-2 min  Labs: Lab Results  Component Value Date   WBC 8.8 07/26/2019   HGB 11.4 (L) 07/26/2019   HCT 34.1 (L) 07/26/2019   MCV 94.2 07/26/2019   PLT 186 07/26/2019    Patient Active Problem List   Diagnosis Date Noted  . Encounter for induction of labor 07/26/2019  . LGA (large for gestational age) fetus affecting management of mother 06/23/2019  . Anemia, antepartum, unspecified trimester 05/26/2019  . GDM, class A2 04/29/2019  . Supervision of high risk pregnancy, antepartum 02/08/2019  . AMA (advanced maternal age) multigravida 35+ 02/08/2019    Assessment / Plan: 43 y.o. G5P4004 at [redacted]w[redacted]d here for [place].  Labor: s/p miso x1 and FB (out at ~1430). Low dose pit starting at 1247, uptitrated to 2x2 since FB out. Successful AROM with FSE with clear fluid. Slightly tachysystole, downtitrate pitocin slightly  Fetal Wellbeing:  Cat II for variable initially present after AROM, now resolving, defer IUPC placement at this time Pain Control:  IV pain meds PRN, epidural upon maternal request GBS: negative Anticipated MOD:  SVD  A2GDM: sugars well controlled since arrival, on q4h checks with NISS. q2h checks active labor.    gHTN: multiple mild range BP's, CBC/CMP not grossly abnormal though notably Cr is 0.87 (no  baseline to compare), UPCR repeated due to abnormal P:C with possible bloody contamination, repeat normal. CTM.   Augustin Coupe, MD/MPH OB Fellow  07/26/2019, 6:51 PM

## 2019-07-26 NOTE — H&P (Addendum)
OBSTETRIC ADMISSION HISTORY AND PHYSICAL  Heather Wilkins is a 43 y.o. female [phone] with IUP at [redacted]w[redacted]d by exact LMP presenting for IOL for GDMA2. She denies contractions, LOF, or vaginal bleeding. Reports good fetal movement.  She received her prenatal care at [facility].   Ultrasounds . Anatomy U/S: normal  Prenatal History/Complications: . A2 Gestational Diabetes (on insulin) . Large for gestational age . Advanced maternal age  Past Medical History: Past Medical History:  Diagnosis Date  . Family planning   . Gestational diabetes     Past Surgical History: Past Surgical History:  Procedure Laterality Date  . CHOLECYSTECTOMY    . CHOLECYSTECTOMY, LAPAROSCOPIC    . INTRAUTERINE DEVICE (IUD) INSERTION      Obstetrical History: OB History    Gravida  5   Para  4   Term  4   Preterm  0   AB  0   Living  4     SAB  0   TAB  0   Ectopic  0   Multiple  0   Live Births  4           Social History: Social History   Socioeconomic History  . Marital status: Single    Spouse name: Not on file  . Number of children: 0  . Years of education: 38  . Highest education level: Not on file  Occupational History  . Occupation: Human resources officer  Social Needs  . Financial resource strain: Not on file  . Food insecurity    Worry: Never true    Inability: Never true  . Transportation needs    Medical: No    Non-medical: No  Tobacco Use  . Smoking status: Never Smoker  . Smokeless tobacco: Never Used  Substance and Sexual Activity  . Alcohol use: No  . Drug use: No  . Sexual activity: Not Currently    Birth control/protection: None  Lifestyle  . Physical activity    Days per week: Not on file    Minutes per session: Not on file  . Stress: Not on file  Relationships  . Social Musician on phone: Not on file    Gets together: Not on file    Attends religious service: Not on file    Active member of club or organization: Not on file     Attends meetings of clubs or organizations: Not on file    Relationship status: Not on file  Other Topics Concern  . Not on file  Social History Narrative   ** Merged History Encounter **        Family History: Family History  Problem Relation Age of Onset  . Diabetes Mother   . Cancer Mother   . Diabetes Father     Allergies: No Known Allergies  Medications Prior to Admission  Medication Sig Dispense Refill Last Dose  . aspirin 81 MG chewable tablet Chew 1 tablet (81 mg total) by mouth daily. 30 tablet 5 07/25/2019 at Unknown time  . insulin NPH Human (NOVOLIN N) 100 UNIT/ML injection Inject 10 units into the skin daily with breakfast and 15 units daily at bedtime 10 mL 2 07/25/2019 at Unknown time  . Insulin Syringes, Disposable, U-100 0.3 ML MISC 1 each by Does not apply route 2 (two) times daily. 100 each 1   . Prenatal Vit-Fe Fumarate-FA (PREPLUS) 27-1 MG TABS Take 1 tablet by mouth daily.        Review of Systems  All systems reviewed and negative except as stated in HPI  Physical Exam  Blood pressure 125/90, pulse 73, temperature 98.4 F (36.9 C), temperature source Oral, resp. rate 16, height 5\' 4"  (1.626 m), weight 87.1 kg, last menstrual period 10/26/2018, unknown if currently breastfeeding. General appearance: alert and no distress Lungs: no respiratory distress Heart: regular rate  Abdomen: soft, non-tender; gravid Extremities: Homans sign is negative, no sign of DVT Presentation: cephalic   Fetal monitoring: Cat 1 tracing; FHR 125, moderate variability, +accels, no decels Uterine activity: contracting irregularly  Dilation: Fingertip Effacement (%): Thick Station: -3 Exam by:: Bridgette Habermann, CNM student  Prenatal labs: ABO, Rh: B/Positive/-- (06/02 1135) Antibody: Negative (06/02 1135) Rubella: 1.69 (06/02 1135) RPR: Non Reactive (07/30 0922)  HBsAg: Negative (06/02 1135)  HIV: Non Reactive (07/30 0922)  GBS: Negative/-- (10/08 1505)  Glucola:  197/99/186 Genetic screening: declined  Prenatal Transfer Tool  Maternal Diabetes: Yes:  Diabetes Type:  Insulin/Medication controlled Genetic Screening: Declined Maternal Ultrasounds/Referrals: Other: EFW 4153g (98%) Fetal Ultrasounds or other Referrals:  None Maternal Substance Abuse:  No Significant Maternal Medications:  Meds include: Other: insulin Significant Maternal Lab Results: Group B Strep negative  Results for orders placed or performed during the hospital encounter of 07/26/19 (from the past 24 hour(s))  Glucose, capillary   Collection Time: 07/26/19  7:24 AM  Result Value Ref Range   Glucose-Capillary 91 70 - 99 mg/dL    Patient Active Problem List   Diagnosis Date Noted  . Encounter for induction of labor 07/26/2019  . LGA (large for gestational age) fetus affecting management of mother 06/23/2019  . Anemia, antepartum, unspecified trimester 05/26/2019  . GDM, class A2 04/29/2019  . Supervision of high risk pregnancy, antepartum 02/08/2019  . AMA (advanced maternal age) multigravida 35+ 02/08/2019    Assessment/Plan:  Heather Wilkins is a 43 y.o. G5P4004 at [redacted]w[redacted]d here for IOL for A2GDM.  Labor:  -- Cervical ripening with Cytotec. Possible foley bulb insertion -- GBS negative -- Pain control: planning epidural -- Cat 1 tracing -- Anticipate SVD -- PPH: medium  A2GDM: -- Sliding scale insuline and CBG q4H  Postpartum Planning -- Breast/bottle -- Planning Paragard IUD  Maryagnes Amos, SNM   OB FELLOW ATTESTATION  I have seen and examined this patient and agree with above documentation in the resident's note except as noted below.  A2GDM on NPH 10/15 here for IOL for GDM. 4 prior NSVD, pelvis proven to 3629 grams. Review of recent sugars shows fairly good control. Will plan to give breakfast and AM NPH with 2hr PP and q4h accuchecks and NISS for today, pending induction progress will d/c diet once starting pitocin or more active labor. Discussed  fetal macrosomia, increased risk of shoulder dystocia with patient. Recheck @ 1200 and assess for foley balloon. For contraception planning Paragard IUD, patient is self pay and will need to be 6wk PP.  Augustin Coupe, MD/MPH OB Fellow  07/26/2019, 8:27 AM

## 2019-07-27 ENCOUNTER — Encounter (HOSPITAL_COMMUNITY): Payer: Self-pay

## 2019-07-27 DIAGNOSIS — O24424 Gestational diabetes mellitus in childbirth, insulin controlled: Secondary | ICD-10-CM

## 2019-07-27 DIAGNOSIS — Z3A39 39 weeks gestation of pregnancy: Secondary | ICD-10-CM

## 2019-07-27 LAB — GLUCOSE, CAPILLARY
Glucose-Capillary: 98 mg/dL (ref 70–99)
Glucose-Capillary: 99 mg/dL (ref 70–99)

## 2019-07-27 MED ORDER — IBUPROFEN 600 MG PO TABS
600.0000 mg | ORAL_TABLET | Freq: Four times a day (QID) | ORAL | Status: DC
  Administered 2019-07-27 – 2019-07-28 (×7): 600 mg via ORAL
  Filled 2019-07-27 (×6): qty 1

## 2019-07-27 MED ORDER — ONDANSETRON HCL 4 MG/2ML IJ SOLN: 4.0000 mg | INTRAMUSCULAR | Status: DC | PRN

## 2019-07-27 MED ORDER — ZOLPIDEM TARTRATE 5 MG PO TABS: 5.0000 mg | ORAL_TABLET | Freq: Every evening | ORAL | Status: DC | PRN

## 2019-07-27 MED ORDER — DIPHENHYDRAMINE HCL 50 MG/ML IJ SOLN: 12.5000 mg | INTRAMUSCULAR | Status: DC | PRN

## 2019-07-27 MED ORDER — ACETAMINOPHEN 325 MG PO TABS: 650.0000 mg | ORAL_TABLET | ORAL | Status: DC | PRN

## 2019-07-27 MED ORDER — DIPHENHYDRAMINE HCL 25 MG PO CAPS: 25.0000 mg | ORAL_CAPSULE | Freq: Four times a day (QID) | ORAL | Status: DC | PRN

## 2019-07-27 MED ORDER — ONDANSETRON HCL 4 MG PO TABS: 4.0000 mg | ORAL_TABLET | ORAL | Status: DC | PRN

## 2019-07-27 MED ORDER — FENTANYL-BUPIVACAINE-NACL 0.5-0.125-0.9 MG/250ML-% EP SOLN: 12.0000 mL/h | EPIDURAL | Status: DC | PRN

## 2019-07-27 MED ORDER — SENNOSIDES-DOCUSATE SODIUM 8.6-50 MG PO TABS
2.0000 | ORAL_TABLET | ORAL | Status: DC
  Administered 2019-07-28: 2 via ORAL
  Filled 2019-07-27: qty 2

## 2019-07-27 MED ORDER — PRENATAL MULTIVITAMIN CH
1.0000 | ORAL_TABLET | Freq: Every day | ORAL | Status: DC
  Administered 2019-07-27 – 2019-07-28 (×2): 1 via ORAL
  Filled 2019-07-27 (×2): qty 1

## 2019-07-27 MED ORDER — DIBUCAINE (PERIANAL) 1 % EX OINT: 1.0000 "application " | TOPICAL_OINTMENT | CUTANEOUS | Status: DC | PRN

## 2019-07-27 MED ORDER — TETANUS-DIPHTH-ACELL PERTUSSIS 5-2.5-18.5 LF-MCG/0.5 IM SUSP: 0.5000 mL | Freq: Once | INTRAMUSCULAR | Status: DC

## 2019-07-27 MED ORDER — OXYCODONE HCL 5 MG PO TABS: 5.0000 mg | ORAL_TABLET | ORAL | Status: DC | PRN

## 2019-07-27 MED ORDER — BENZOCAINE-MENTHOL 20-0.5 % EX AERO: 1.0000 "application " | INHALATION_SPRAY | CUTANEOUS | Status: DC | PRN

## 2019-07-27 MED ORDER — WITCH HAZEL-GLYCERIN EX PADS: 1.0000 "application " | MEDICATED_PAD | CUTANEOUS | Status: DC | PRN

## 2019-07-27 MED ORDER — COCONUT OIL OIL: 1.0000 "application " | TOPICAL_OIL | Status: DC | PRN

## 2019-07-27 MED ORDER — OXYCODONE HCL 5 MG PO TABS: 10.0000 mg | ORAL_TABLET | ORAL | Status: DC | PRN

## 2019-07-27 MED ORDER — SIMETHICONE 80 MG PO CHEW: 80.0000 mg | CHEWABLE_TABLET | ORAL | Status: DC | PRN

## 2019-07-27 NOTE — Discharge Instructions (Signed)

## 2019-07-27 NOTE — Lactation Note (Signed)
This note was copied from a baby's chart. Lactation Consultation Note  Patient Name: Heather Wilkins Today's Date: 07/27/2019    P5 mother whose infant is now 41 hours old.  Mother had baby latched in the cradle hold on the left breast when I arrived.  Baby was swaddled and had a shallow latch.  Mother stated that he keeps falling asleep while feeding.  Reassured her that this is typical behavior for a baby at this age.  Asked her permission to remove the blanket and try to awaken him.  She was reluctant to remove his blanket but did so at my request.    Mother's breasts are soft and non tender and nipples are everted and intact.  Showed mother how she can help awaken baby and then assisted him to latch deeply into breast tissue.  He began to awaken more and suck with a rhythmic motion.  Demonstrated breast compressions and gentle stimulation to help keep him awake.  Mother reminded to keep fingers back from baby's nose.  Explained how baby needs to be higher up in her arm if she wants to continue using the cradle hold.  Mother receptive to ideas and continued to feed.  I observed him feeding for 5 minutes prior to leaving her room.  Mother does not have a DEBP for home use but plans to call the [facility] office and talk to her contact person about obtaining one.  She will call for latch assistance as needed.  Mother familiar with cues and hand expression.   Maternal Data    Feeding Feeding Type: Breast Fed  LATCH Score Latch: Grasps breast easily, tongue down, lips flanged, rhythmical sucking.  Audible Swallowing: A few with stimulation  Type of Nipple: Everted at rest and after stimulation  Comfort (Breast/Nipple): Soft / non-tender  Hold (Positioning): No assistance needed to correctly position infant at breast.  LATCH Score: 9  Interventions    Lactation Tools Discussed/Used     Consult Status      Heather Wilkins 07/27/2019, 4:23 PM

## 2019-07-27 NOTE — Lactation Note (Signed)
This note was copied from a baby's chart. Lactation Consultation Note  Patient Name: [place] Today's Date: 07/27/2019 Reason for consult: Initial assessment;Term P4, 6 hour female infant. Mom receives [facility] in [place] and doesn't have DEBP at home Infant had large emesis with Nurse while [place] in room, Nurse burped and suction infant. Mom started doing STS afterwards. Mom will continue to breastfeed infant according hunger cues and if infant will not latch mom will hand express and give infant back volume. Per nurse, infant has been spitty.  Per mom, this is infant's third time having emesis,  infant nursed well in L&D and breastfed one time while in room.. Mom will continue to breastfeed infant according hunger cues, 8 to 12 times within 24 hours and on demand. Mom knows to call Nurse or [place] if she needs assistance with latching infant to breast. Reviewed Baby & Me book's Breastfeeding Basics.    Maternal Data Formula Feeding for Exclusion: Yes Reason for exclusion: Mother's choice to formula and breast feed on admission Has patient been taught Hand Expression?: Yes Does the patient have breastfeeding experience prior to this delivery?: Yes  Feeding Feeding Type: Breast Fed  LATCH Score             Interventions Interventions: Breast feeding basics reviewed;Hand pump;Hand express;Expressed milk  Lactation Tools Discussed/Used WIC Program: Yes Pump Review: Setup, frequency, and cleaning;Milk Storage Initiated by:: Vicente Serene, IBCLC Date initiated:: 07/27/19   Consult Status Consult Status: Follow-up Date: 07/27/19 Follow-up type: In-patient    Vicente Serene 07/27/2019, 6:50 AM

## 2019-07-27 NOTE — Discharge Summary (Signed)
Postpartum Discharge Summary      Patient Name: Heather Wilkins DOB: July 12, 1976 MRN: [id number]  Date of admission: 07/26/2019 Delivering Provider: Merilyn Baba   Date of discharge: 07/28/2019  Admitting diagnosis: PREG Intrauterine pregnancy: [redacted]w[redacted]d    Secondary diagnosis:  Active Problems:   Encounter for induction of labor   SVD (spontaneous vaginal delivery)  Additional problems: A2GDM, intrapartum gHTN     Discharge diagnosis: Term Pregnancy Delivered                                                                                                Post partum procedures:None  Augmentation: AROM, Pitocin, Cytotec and Foley Balloon  Complications: None  Hospital course:  Induction of Labor With Vaginal Delivery   43y.o. yo [id number] admitted to the hospital 07/26/2019 for induction of labor.  Indication for induction: A2 DM.  Patient's labor course is as follows:  She was admitted and cervical ripening was started with cytotec. A FB was subsequently able to be placed. After the fB came out, she was started on low-dose pitocin, and was able to be AROMed shortly after. She progressed to complete and delivered shortly after.  Membrane Rupture Time/Date: 6:42 PM ,07/26/2019   Intrapartum Procedures: Episiotomy: None [1]                                         Lacerations:  Periurethral [8]  Patient had delivery of a Viable infant.  Information for the patient's newborn:  ZaAshling, Roane0[[id number]]Delivery Method: Vaginal, Spontaneous(Filed from Delivery Summary)    07/27/2019  Details of delivery can be found in separate delivery note.  Patient had a routine postpartum course. Patient is discharged home 07/28/19. Delivery time: 12:32 AM    Magnesium Sulfate received: No BMZ received: No Rhophylac:N/A MMR:N/A Transfusion:No  Physical exam  Vitals:   07/27/19 0740 07/27/19 1150 07/27/19 1442 07/27/19 2108  BP: 125/65 (!) 111/55 (!) 115/55  121/71  Pulse: 81 70 71 72  Resp: _0 Temp: 98.2 F (36.8 C) 98.1 F (36.7 C) 98.2 F (36.8 C) 98.1 F (36.7 C)  TempSrc: Oral Oral Oral Oral  SpO2:  99% 99% 99%  Weight:      Height:       General: alert, cooperative and no distress Lochia: appropriate Uterine Fundus: firm Incision: N/A DVT Evaluation: No evidence of DVT seen on physical exam. Labs: Lab Results  Component Value Date   WBC 8.8 07/26/2019   HGB 11.4 (L) 07/26/2019   HCT 34.1 (L) 07/26/2019   MCV 94.2 07/26/2019   PLT 186 07/26/2019   CMP Latest Ref Rng & Units 07/26/2019  Glucose 70 - 99 mg/dL 88  BUN 6 - 20 mg/dL 10  Creatinine 0.44 - 1.00 mg/dL 0.87  Sodium 135 - 145 mmol/L 137  Potassium 3.5 - 5.1 mmol/L 3.8  Chloride 98 - 111 mmol/L 110  CO2 22 - 32 mmol/L 18(L)  Calcium  8.9 - 10.3 mg/dL 8.7(L)  Total Protein 6.5 - 8.1 g/dL 6.4(L)  Total Bilirubin 0.3 - 1.2 mg/dL 0.6  Alkaline Phos 38 - 126 U/L 102  AST 15 - 41 U/L 25  ALT 0 - 44 U/L 21    Discharge instruction: per After Visit Summary and "Baby and Me Booklet".  After visit meds:  Allergies as of 07/28/2019   No Known Allergies     Medication List    STOP taking these medications   aspirin 81 MG chewable tablet   insulin NPH Human 100 UNIT/ML injection Commonly known as: NOVOLIN N   Insulin Syringes (Disposable) U-100 0.3 ML Misc     TAKE these medications   ibuprofen 600 MG tablet Commonly known as: ADVIL Take 1 tablet (600 mg total) by mouth every 6 (six) hours.   PrePLUS 27-1 MG Tabs Take 1 tablet by mouth daily.       Diet: routine diet  Activity: Advance as tolerated. Pelvic rest for 6 weeks.   Outpatient follow up:4 weeks Follow up Appt: Future Appointments  Date Time Provider [place]  08/29/2019  8:15 AM Woodroe Mode, MD [place]  08/29/2019  8:50 AM [facility]   Follow up Visit: [place] for [facility] Follow up.    Specialty: Obstetrics and Gynecology Why: In 4-6 weeks as scheduled Contact information: [address] [place], [place] [id number] [phone]         Please schedule this patient for Postpartum visit in: 4 weeks with the following provider: Any provider For C/S patients schedule nurse incision check in weeks 2 weeks: no High risk pregnancy complicated by: [id number], intrapartum gHTN Delivery mode:  SVD Anticipated Birth Control:  IUD PP Procedures needed: 2 hour GTT, post partum IUD insertion (paragard) Schedule Integrated BH visit: no   Newborn Data: Live born female  Birth Weight:  3510 g, 7lb 11.8 oz APGAR: 9,9   Newborn Delivery   Birth date/time: 07/27/2019 00:32:00 Delivery type: Vaginal, Spontaneous      Baby Feeding: Breast Disposition:home with mother   07/28/2019 Fatima Blank, CNM

## 2019-07-27 NOTE — Anesthesia Postprocedure Evaluation (Signed)
Anesthesia Post Note  Patient: Irvin Torregrossa  Procedure(s) Performed: AN AD HOC LABOR EPIDURAL     Patient location during evaluation: Mother Baby Anesthesia Type: Epidural Level of consciousness: awake and alert and oriented Pain management: satisfactory to patient Vital Signs Assessment: post-procedure vital signs reviewed and stable Respiratory status: respiratory function stable Cardiovascular status: stable Postop Assessment: no headache, no backache, epidural receding, patient able to bend at knees, no signs of nausea or vomiting and adequate PO intake Anesthetic complications: no    Last Vitals:  Vitals:   07/27/19 0238 07/27/19 0348  BP: 127/65 110/70  Pulse: 85 92  Resp: 16 18  Temp: 37.2 C 37.1 C  SpO2: 99%     Last Pain:  Vitals:   07/27/19 0348  TempSrc: Oral  PainSc: 0-No pain   Pain Goal:                   Para Cossey

## 2019-07-28 MED ORDER — IBUPROFEN 600 MG PO TABS: 600.0000 mg | ORAL_TABLET | Freq: Four times a day (QID) | ORAL | 0 refills | Status: AC

## 2019-08-23 ENCOUNTER — Other Ambulatory Visit: Payer: Self-pay | Admitting: *Deleted

## 2019-08-23 DIAGNOSIS — O24429 Gestational diabetes mellitus in childbirth, unspecified control: Secondary | ICD-10-CM

## 2019-08-29 ENCOUNTER — Other Ambulatory Visit: Payer: Self-pay

## 2019-08-29 ENCOUNTER — Encounter: Payer: Self-pay | Admitting: Obstetrics & Gynecology

## 2019-08-29 ENCOUNTER — Ambulatory Visit (INDEPENDENT_AMBULATORY_CARE_PROVIDER_SITE_OTHER): Payer: Self-pay | Admitting: Obstetrics & Gynecology

## 2019-08-29 VITALS — BP 123/68 | HR 67 | Wt 178.7 lb

## 2019-08-29 DIAGNOSIS — O24419 Gestational diabetes mellitus in pregnancy, unspecified control: Secondary | ICD-10-CM

## 2019-08-29 DIAGNOSIS — O24429 Gestational diabetes mellitus in childbirth, unspecified control: Secondary | ICD-10-CM

## 2019-08-29 LAB — POCT URINALYSIS DIP (DEVICE)
Bilirubin Urine: NEGATIVE
Glucose, UA: NEGATIVE mg/dL
Ketones, ur: NEGATIVE mg/dL
Leukocytes,Ua: NEGATIVE
Nitrite: NEGATIVE
Protein, ur: NEGATIVE mg/dL
Specific Gravity, Urine: 1.025 (ref 1.005–1.030)
Urobilinogen, UA: 0.2 mg/dL (ref 0.0–1.0)
pH: 5.5 (ref 5.0–8.0)

## 2019-08-29 NOTE — Progress Notes (Signed)
Pt states was to get IUD in hospital but she claims that they forgot, Pt does not have ins. So wants BC Pills.

## 2019-08-29 NOTE — Patient Instructions (Signed)

## 2019-08-29 NOTE — Progress Notes (Signed)
Subjective:     Heather Wilkins is a 43 y.o. female who presents for a postpartum visit. She is 4 weeks postpartum following a spontaneous vaginal delivery. I have fully reviewed the prenatal and intrapartum course. The delivery was at 39/1 gestational weeks. Outcome: spontaneous vaginal delivery. Anesthesia: epidural. Postpartum course has been good. Baby's course has been good. Baby is feeding by both breast and bottle - [place] Good Start. Bleeding no bleeding. Bowel function is normal. Bladder function is normal. Patient is not sexually active. Contraception method is none. Postpartum depression screening: negative.  The following portions of the patient's history were reviewed and updated as appropriate: allergies, current medications, past family history, past medical history, past social history, past surgical history and problem list.  Review of Systems Genitourinary:positive for frequency and and urge   Objective:    LMP 10/26/2018 (Exact Date)   General:  alert, cooperative and no distress           Abdomen: soft, non-tender; bowel sounds normal; no masses,  no organomegaly   Vulva:  not evaluated  Vagina: not evaluated                    Assessment:     normal postpartum exam. Pap smear not done at today's visit.   Plan:    1. Contraception: OCP (estrogen/progesterone) 2. Refer to HD for IUD 3. Follow up in: 2 weeks for 2 hr GTT due to GDM  Woodroe Mode, MD 08/29/2019

## 2019-08-30 LAB — GLUCOSE TOLERANCE, 2 HOURS
Glucose, 2 hour: 156 mg/dL — ABNORMAL HIGH (ref 65–139)
Glucose, GTT - Fasting: 96 mg/dL (ref 65–99)

## 2019-09-13 ENCOUNTER — Encounter: Payer: Self-pay | Admitting: General Practice

## 2019-12-26 IMAGING — US US FETAL BPP W/ NON-STRESS
1 series · 13 of 13 positions shown · non-contrast
Comparison: none

[Series 1: us fetal bpp w/nonstress · 13 acquisitions, 13 frames shown]
[im 1/13]
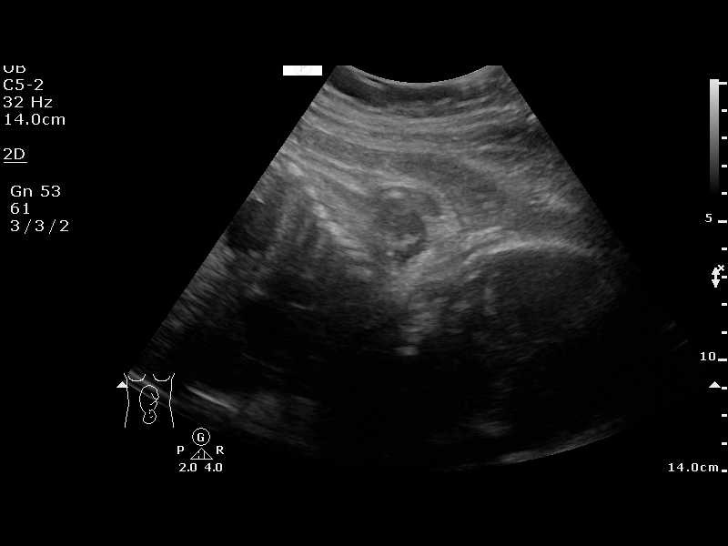
[im 2/13]
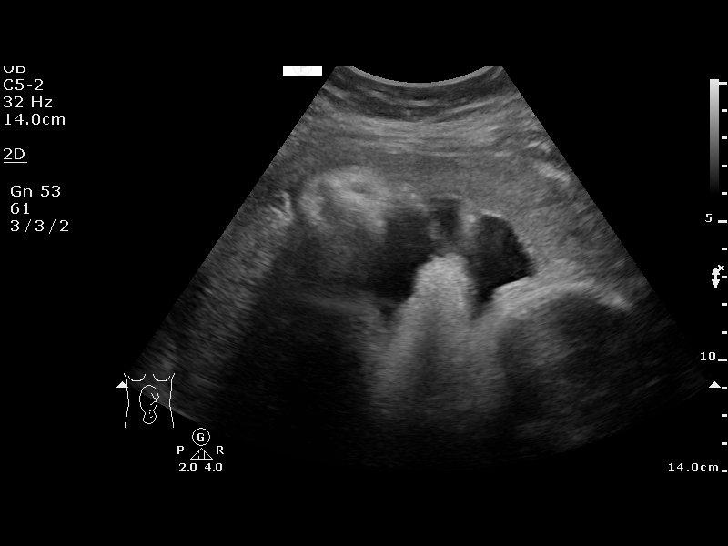
[im 3/13]
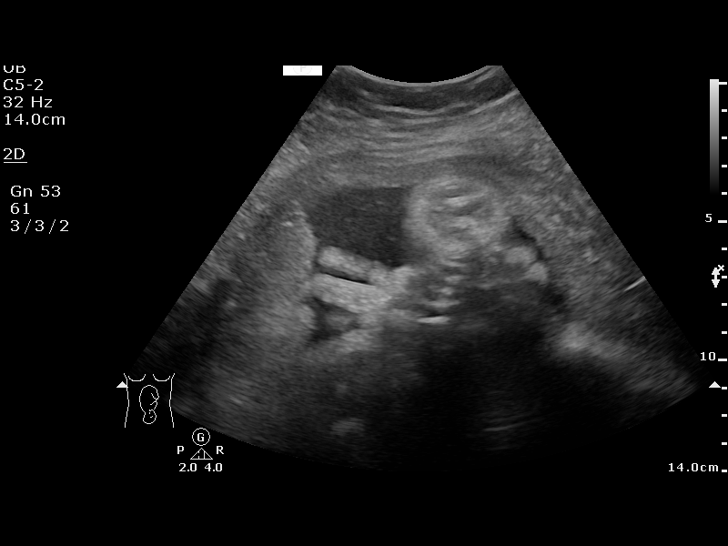
[im 4/13]
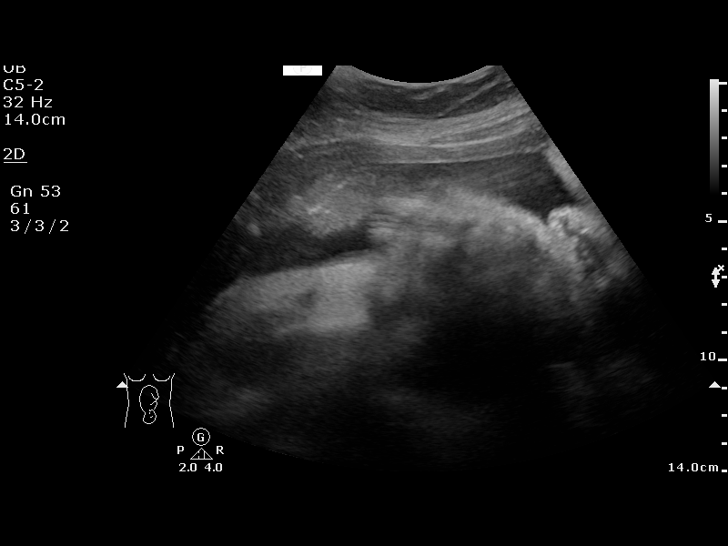
[im 5/13]
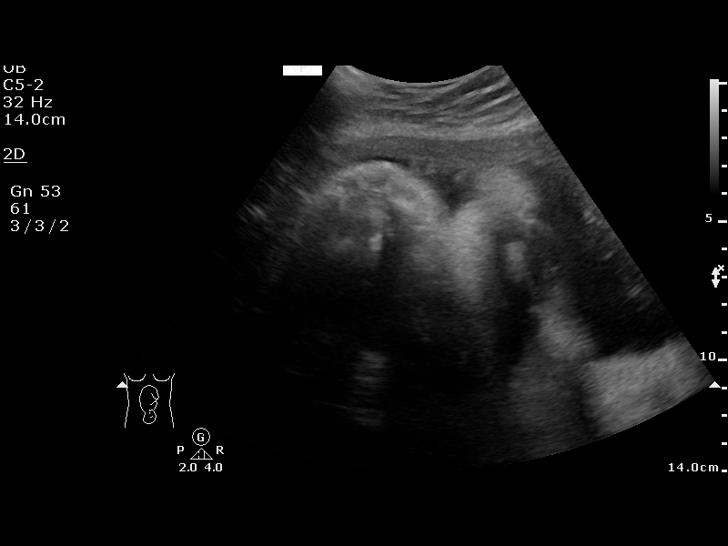
[im 6/13]
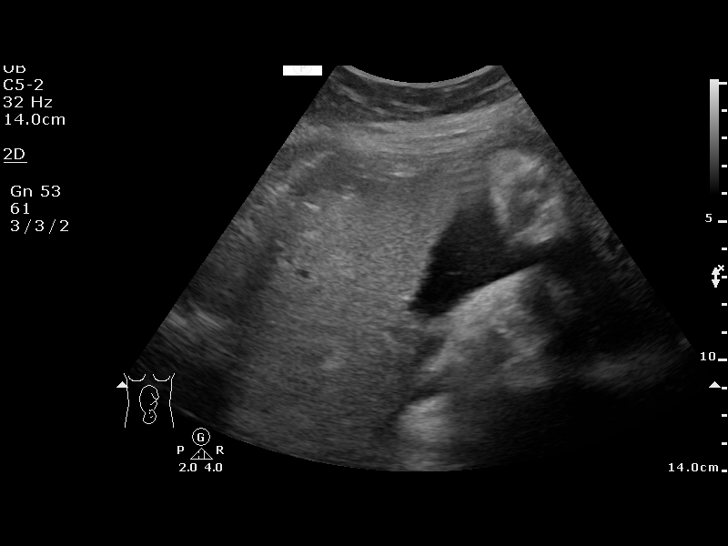
[im 7/13]
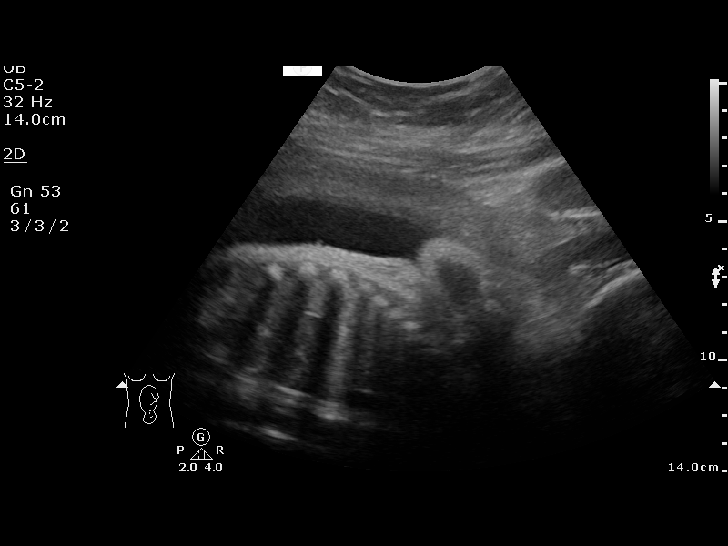
[im 8/13]
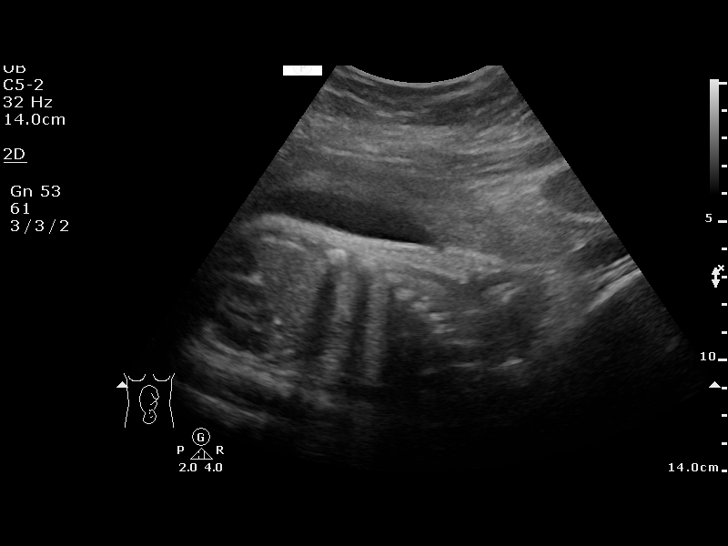
[im 9/13]
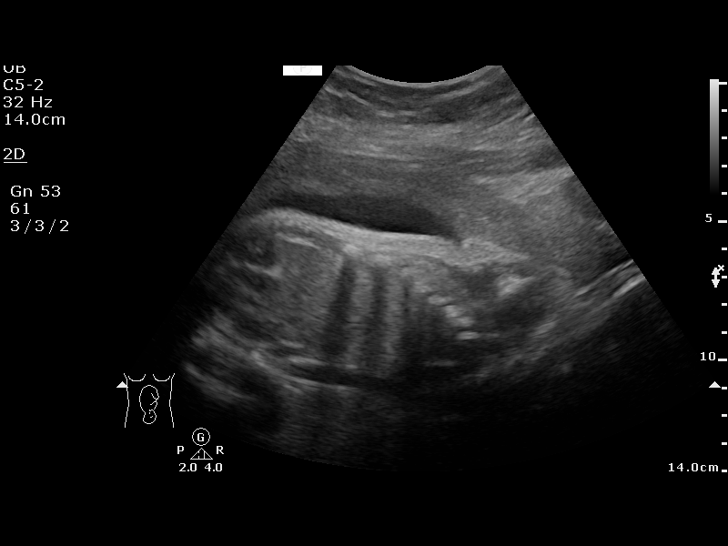
[im 10/13]
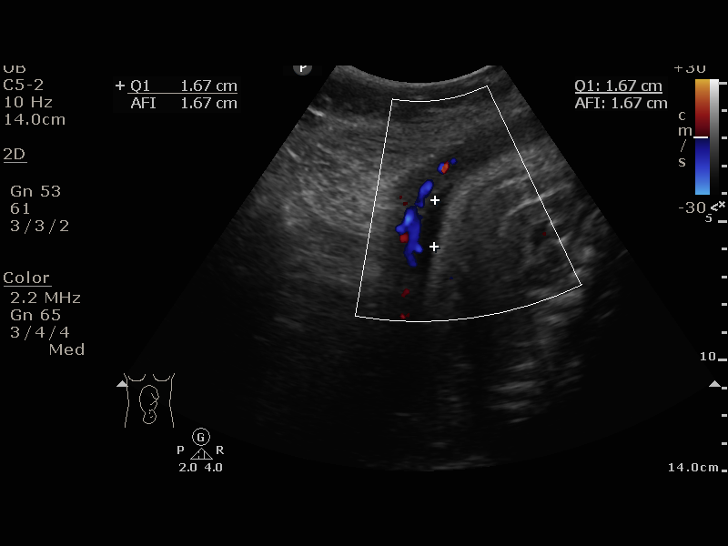
[im 11/13]
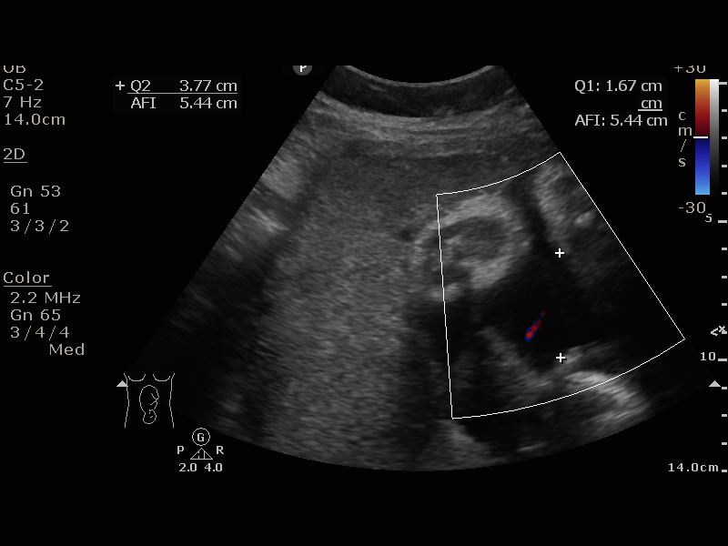
[im 12/13]
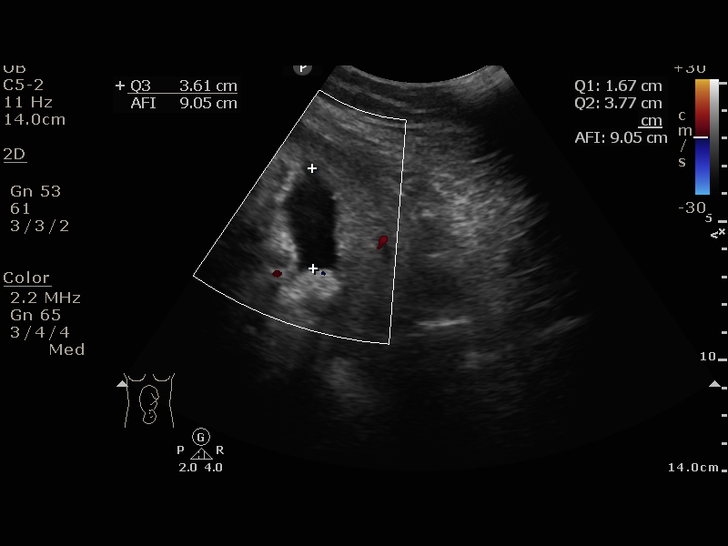
[im 13/13]
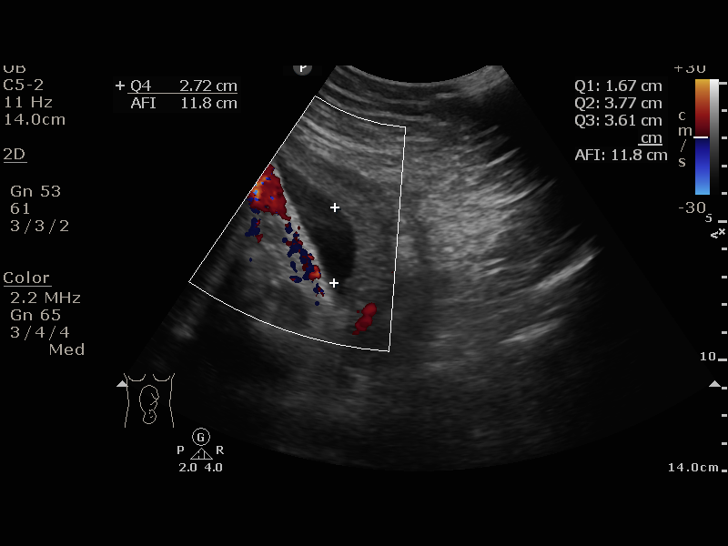

[13 of 13 positions shown; findings below may reference images not displayed]

[address]
                                                            Women's
                                                            [REDACTED]

  1  US FETAL BPP W/NONSTRESS             76818.4      PRINCE ADU BIGAILS
 ----------------------------------------------------------------------

 ----------------------------------------------------------------------
Indications

  36 weeks gestation of pregnancy
  Advanced maternal age multigravida 35+,
  third trimester
  Gestational diabetes in pregnancy, insulin
  controlled
 ----------------------------------------------------------------------
Fetal Evaluation

 Num Of Fetuses:         1
 Preg. Location:         Intrauterine
 Cardiac Activity:       Observed
 Fetal Lie:              Maternal right side
 Presentation:           Cephalic

 Amniotic Fluid
 AFI FV:      Within normal limits

 AFI Sum(cm)     %Tile       Largest Pocket(cm)
 11.77           35

 RUQ(cm)       RLQ(cm)       LUQ(cm)        LLQ(cm)

Biophysical Evaluation

 Amniotic F.V:   Pocket => 2 cm             F. Tone:        Observed
 F. Movement:    Observed                   N.S.T:          Reactive
 F. Breathing:   Observed                   Score:          [DATE]
OB History

 Gravidity:    5         Term:   4
 Living:       4
Gestational Age

 Best:          36w 0d     Det. By:  Previous Ultrasound      EDD:   08/02/19
Impression

Recommendations

 Continue routine antenatal testing.

## 2020-06-20 ENCOUNTER — Other Ambulatory Visit: Payer: Self-pay | Admitting: Obstetrics & Gynecology

## 2020-06-20 DIAGNOSIS — Z1231 Encounter for screening mammogram for malignant neoplasm of breast: Secondary | ICD-10-CM

## 2020-07-03 ENCOUNTER — Ambulatory Visit
Admission: RE | Admit: 2020-07-03 | Discharge: 2020-07-03 | Disposition: A | Discharge status: Home / Self Care | Payer: No Typology Code available for payment source | Source: Ambulatory Visit | Attending: Obstetrics & Gynecology | Admitting: Obstetrics & Gynecology

## 2020-07-03 ENCOUNTER — Other Ambulatory Visit: Payer: Self-pay

## 2020-07-03 DIAGNOSIS — Z1231 Encounter for screening mammogram for malignant neoplasm of breast: Secondary | ICD-10-CM

## 2020-12-24 IMAGING — MG DIGITAL SCREENING BILAT W/ TOMO W/ CAD
8 series · 8 of 24 positions shown · non-contrast
Comparison: None.

CLINICAL DATA: Screening.

EXAM:
DIGITAL SCREENING BILATERAL MAMMOGRAM WITH TOMO AND CAD

[L MLO synth-2D]
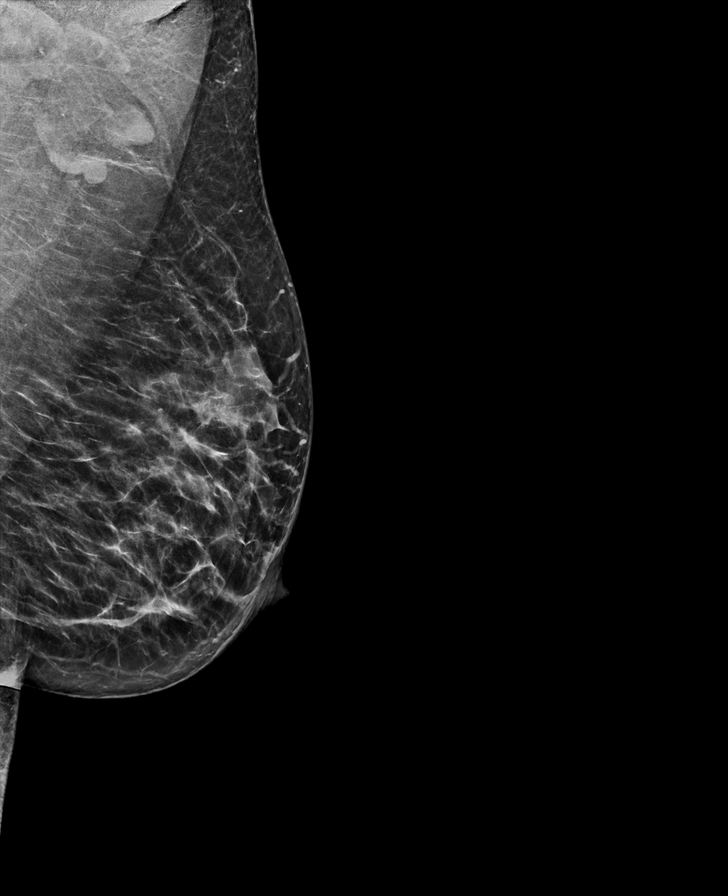

[R CC synth-2D]
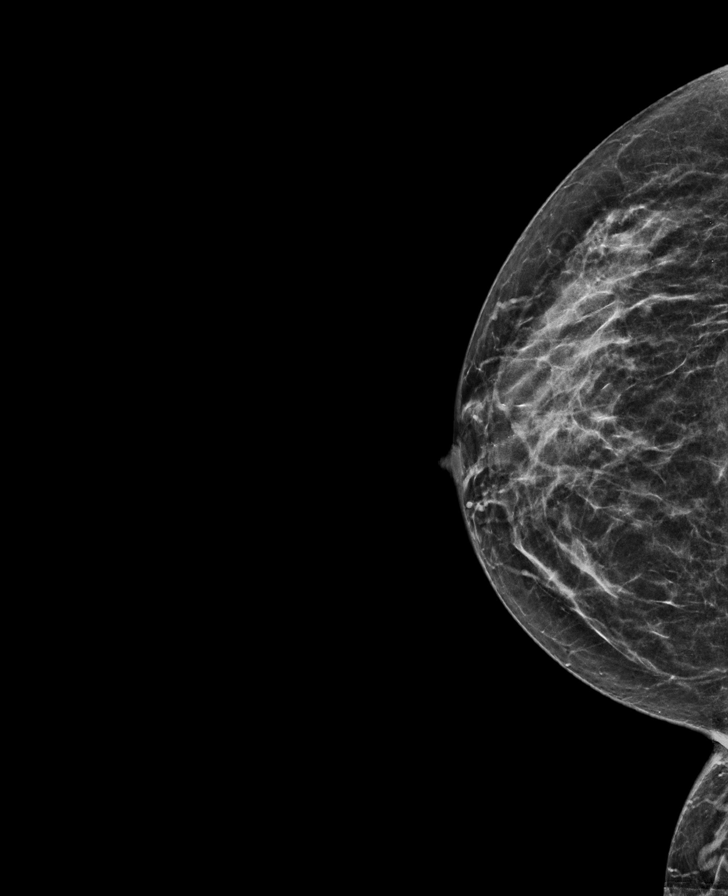

[L CC synth-2D]
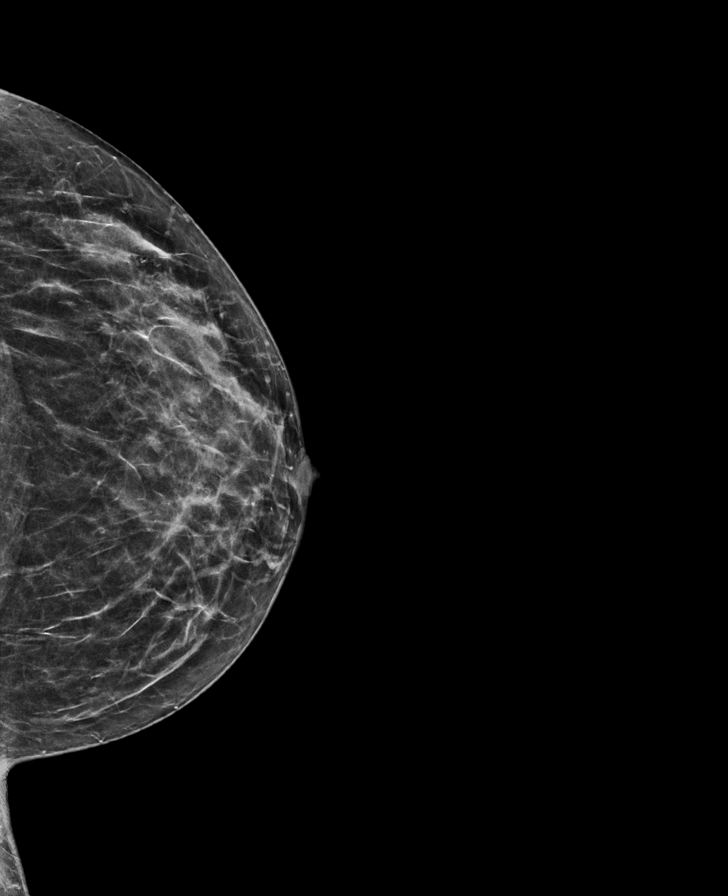

[R MLO synth-2D]
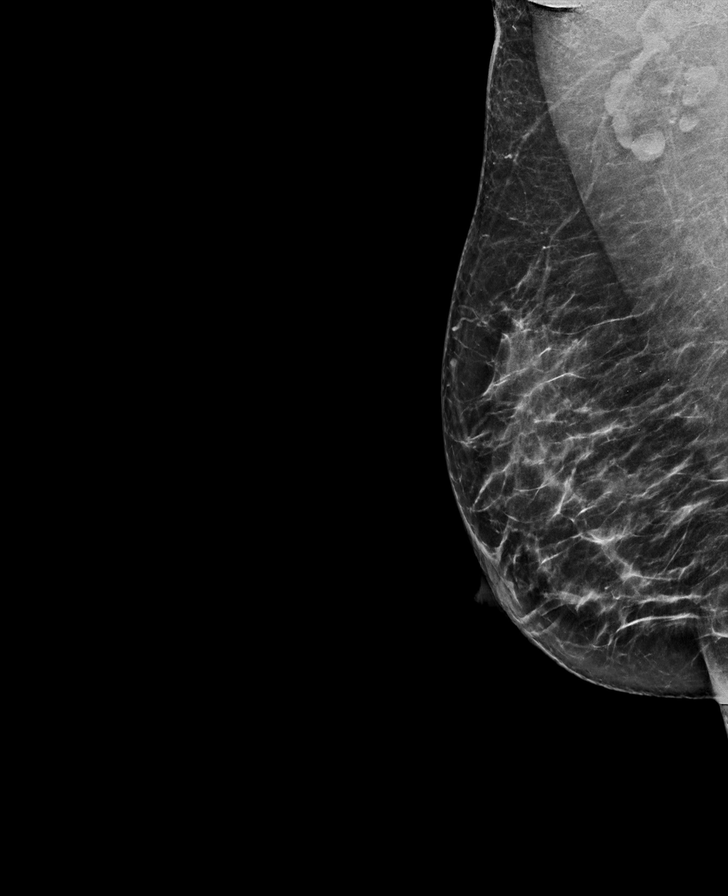

[R MLO tomo · tomo slice 31/60.0]
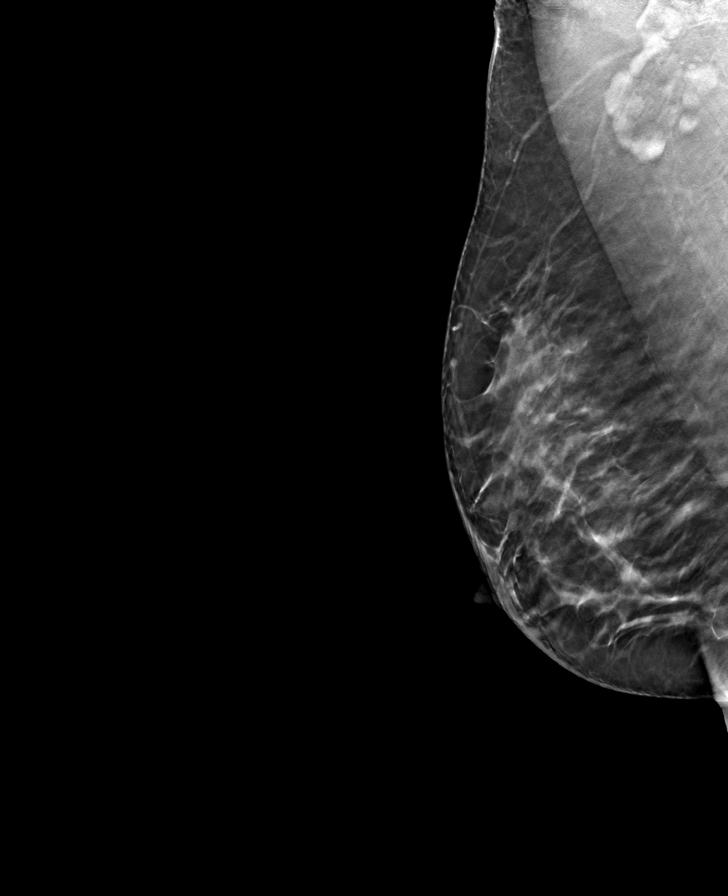

[L CC tomo · tomo slice 26/51.0]
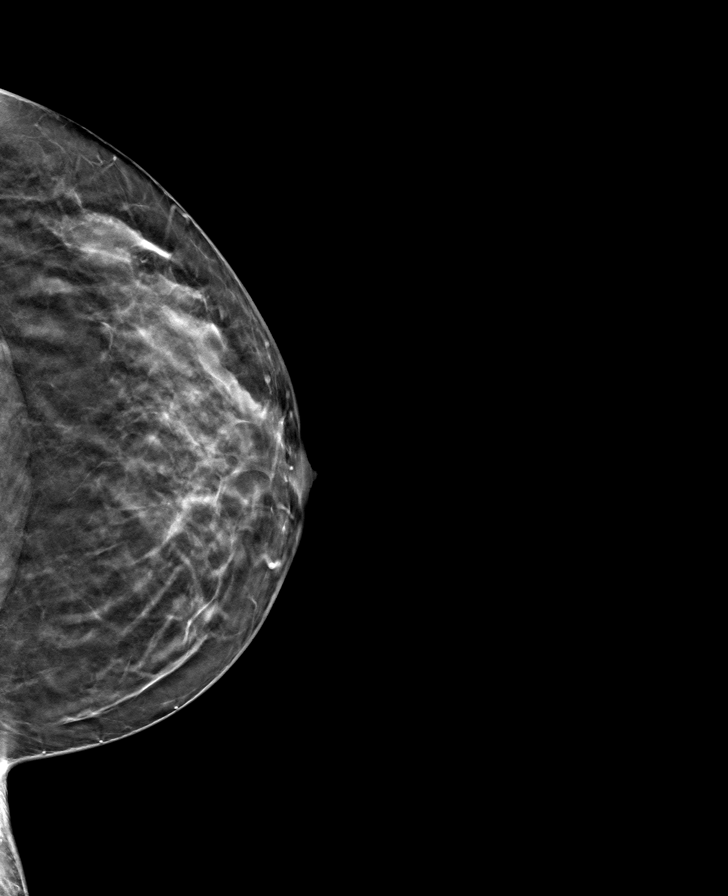

[L MLO tomo · tomo slice 30/59.0]
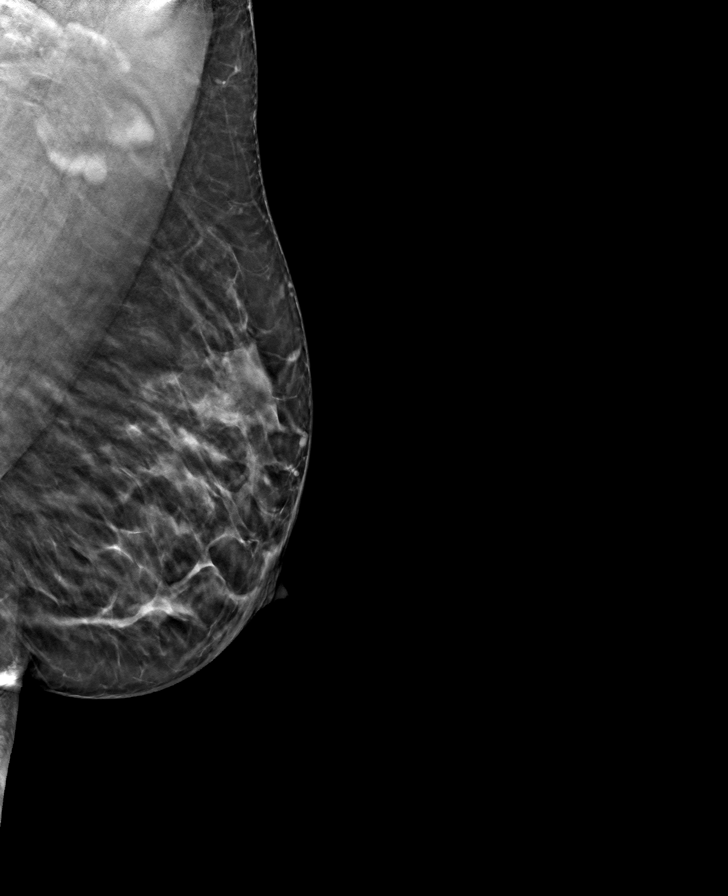

[R CC tomo · tomo slice 25/49.0]
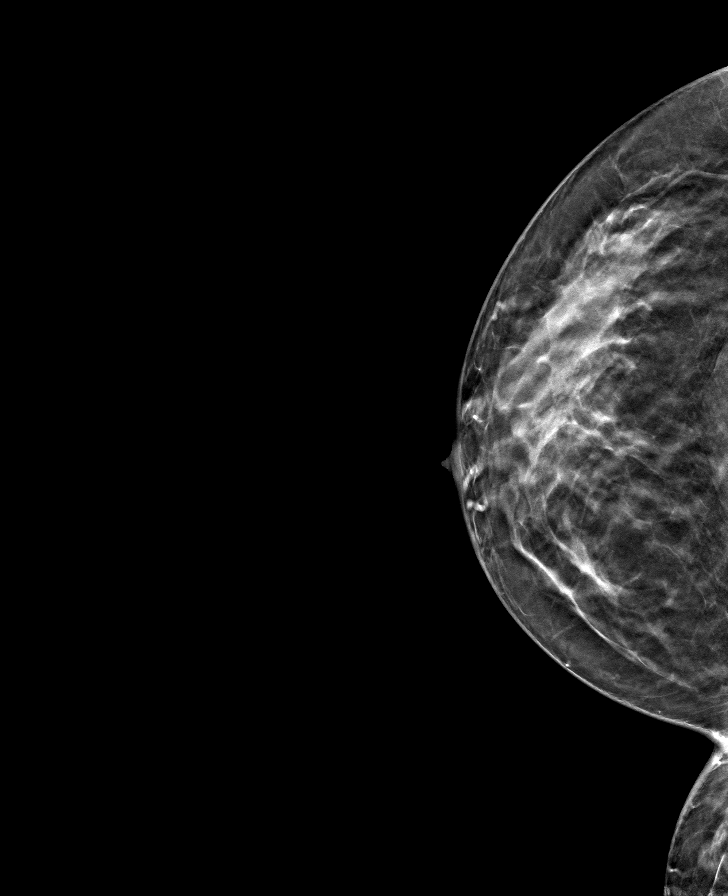

[8 of 24 positions shown; findings below may reference images not displayed]

ACR Breast Density Category c: The breast tissue is heterogeneously
dense, which may obscure small masses
FINDINGS: There are no findings suspicious for malignancy. Images were
processed with CAD.
IMPRESSION: No mammographic evidence of malignancy. A result letter of this
screening mammogram will be mailed directly to the patient.

RECOMMENDATION:
Screening mammogram in one year. (Code:EM-2-IHY)

BI-RADS CATEGORY  1: Negative.
